# Patient Record
Sex: Female | Born: 2000 | Race: White | Hispanic: No | Marital: Single | Smoking: Current every day smoker
Health system: Southern US, Community
[De-identification: ages and names within clinical notes are randomized; demographics above are authoritative.]

## PROBLEM LIST (undated history)

## (undated) DIAGNOSIS — F419 Anxiety disorder, unspecified: Secondary | ICD-10-CM

## (undated) DIAGNOSIS — F1910 Other psychoactive substance abuse, uncomplicated: Secondary | ICD-10-CM

## (undated) DIAGNOSIS — F32A Depression, unspecified: Secondary | ICD-10-CM

## (undated) DIAGNOSIS — F1110 Opioid abuse, uncomplicated: Secondary | ICD-10-CM

## (undated) DIAGNOSIS — F329 Major depressive disorder, single episode, unspecified: Secondary | ICD-10-CM

## (undated) HISTORY — PX: NO PAST SURGERIES: SHX2092

---
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
## 2020-06-11 IMAGING — CR PORTABLE CHEST - 1 VIEW
2 series · 2 of 2 positions shown · non-contrast
Comparison: None.

CLINICAL DATA: 18 y/o  F; overdose.

EXAM:
PORTABLE CHEST 1 VIEW

[portable (1 of 2)]
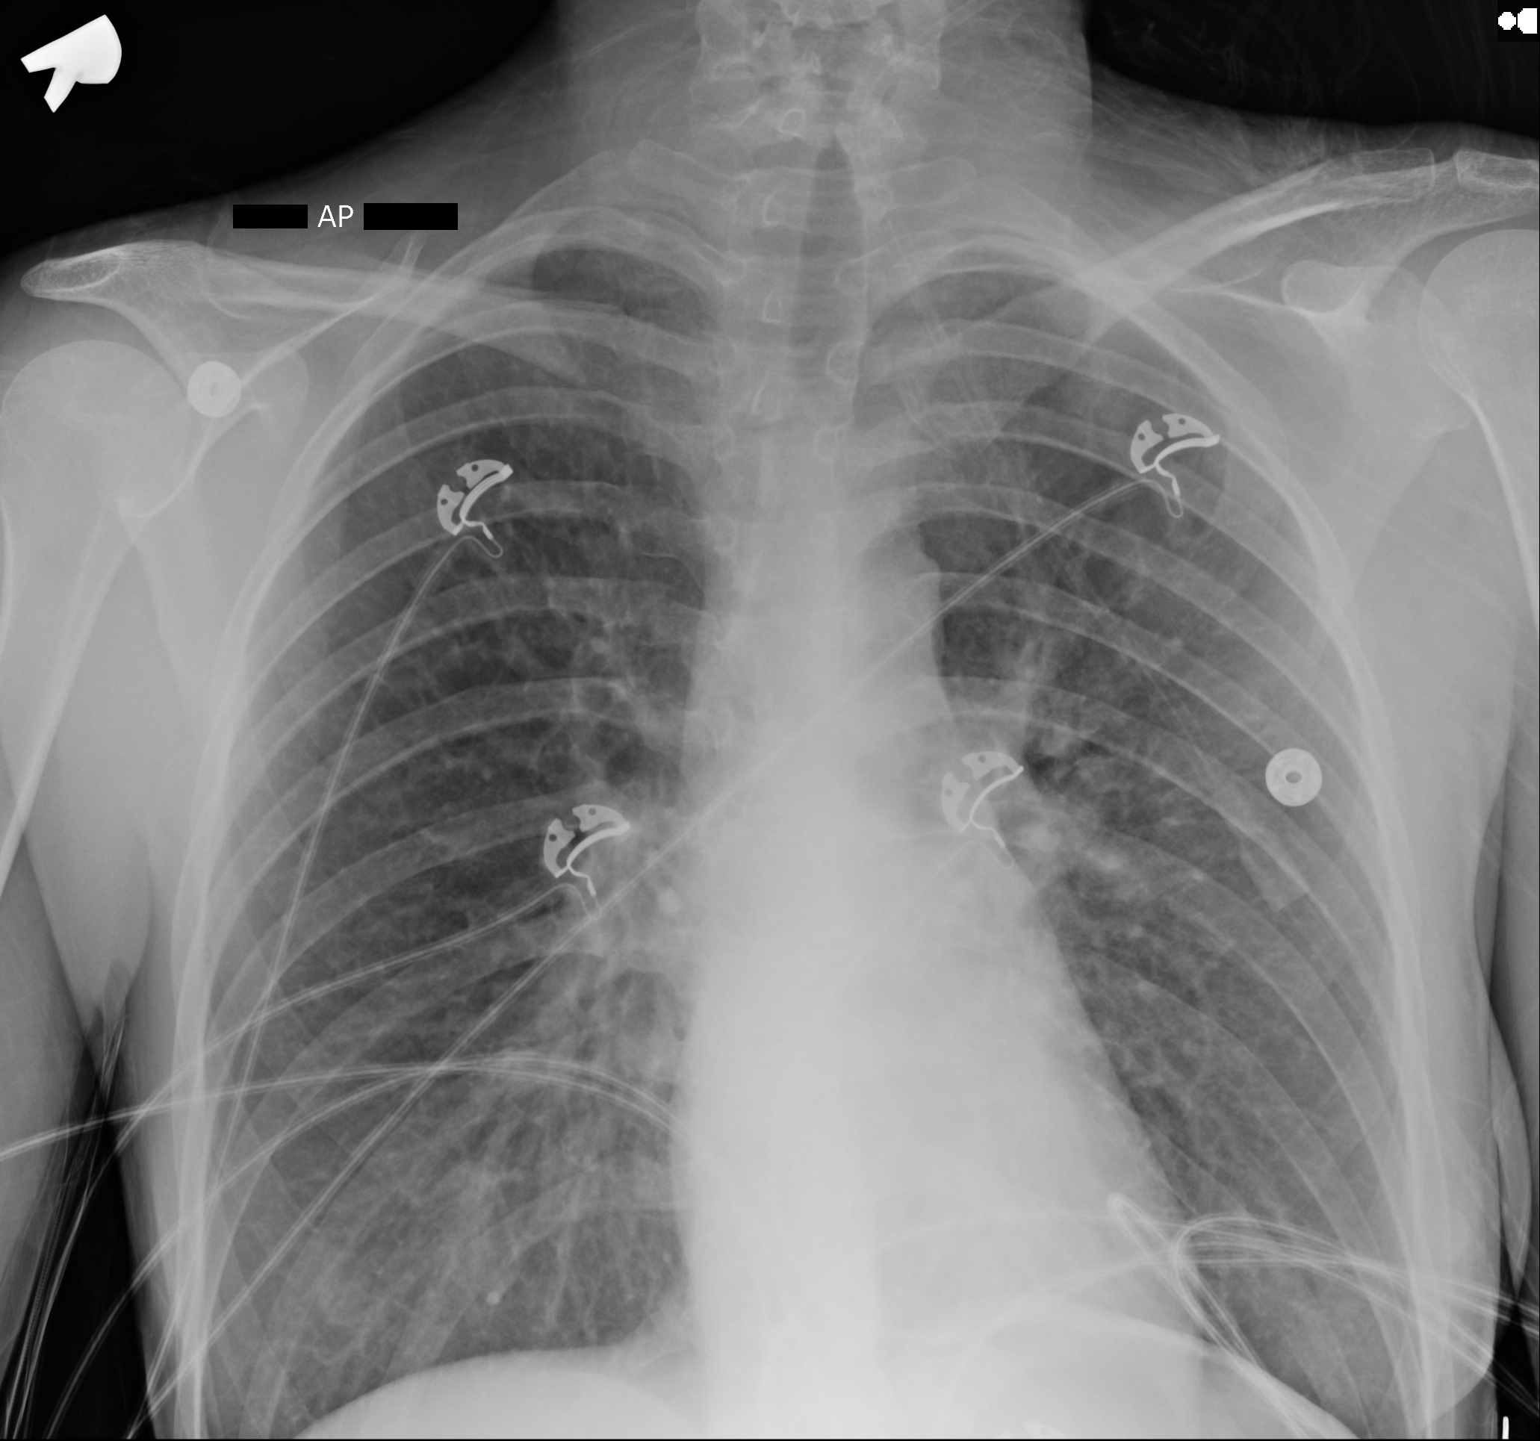

[portable (2 of 2)]
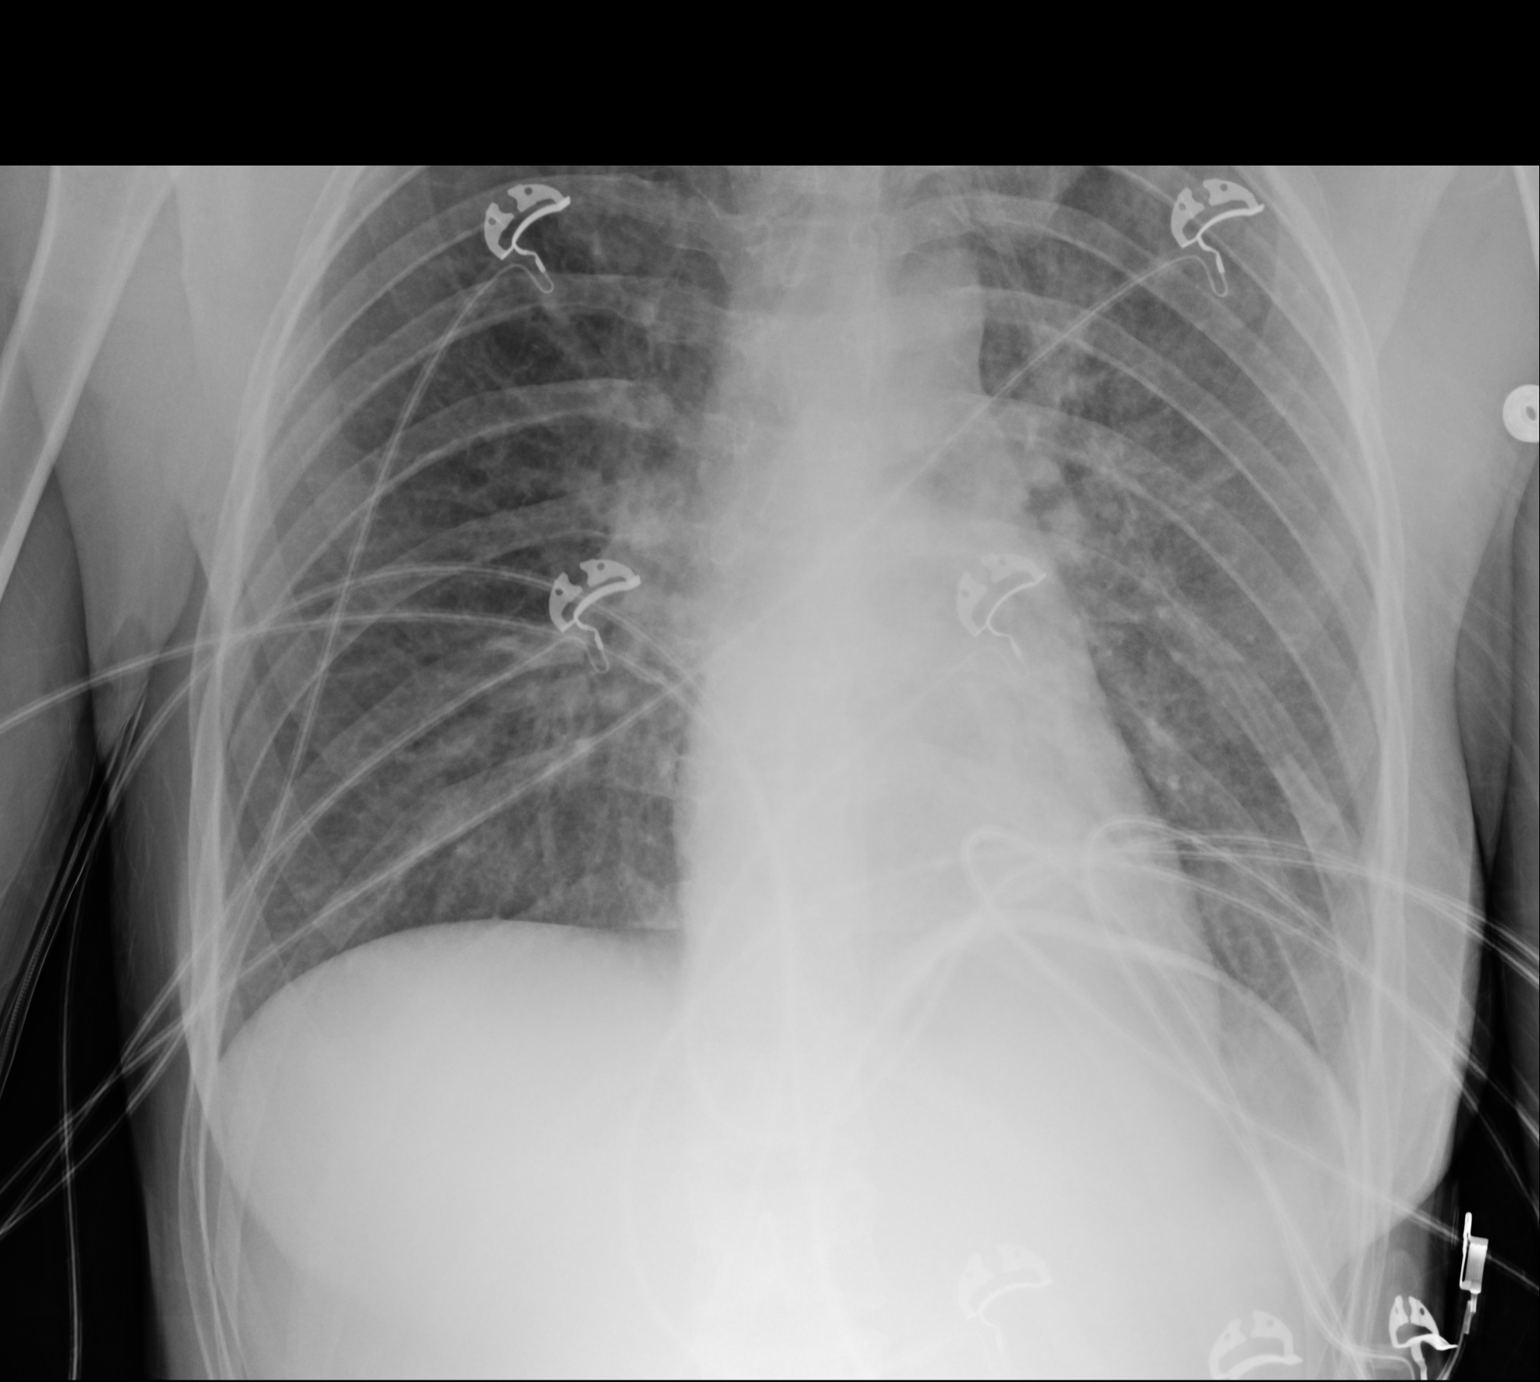

[2 of 2 positions shown; findings below may reference images not displayed]

FINDINGS: Normal cardiac silhouette. Hazy central opacification of the lungs.
No pleural effusion or pneumothorax. Bones are unremarkable.
IMPRESSION: Hazy central opacification of lungs, possibly mild noncardiogenic
pulmonary edema related to overdose.

## 2020-10-15 ENCOUNTER — Other Ambulatory Visit: Payer: Self-pay

## 2020-10-15 ENCOUNTER — Observation Stay (HOSPITAL_COMMUNITY)
Admission: EM | Admit: 2020-10-15 | Discharge: 2020-10-15 | Discharge status: Against Medical Advice | Payer: Medicaid Other | Attending: General Surgery | Admitting: General Surgery

## 2020-10-15 ENCOUNTER — Encounter (HOSPITAL_COMMUNITY): Payer: Self-pay | Admitting: Emergency Medicine

## 2020-10-15 ENCOUNTER — Emergency Department (HOSPITAL_COMMUNITY): Payer: Medicaid Other

## 2020-10-15 DIAGNOSIS — F17210 Nicotine dependence, cigarettes, uncomplicated: Secondary | ICD-10-CM | POA: Diagnosis not present

## 2020-10-15 DIAGNOSIS — Z79899 Other long term (current) drug therapy: Secondary | ICD-10-CM | POA: Diagnosis not present

## 2020-10-15 DIAGNOSIS — R791 Abnormal coagulation profile: Secondary | ICD-10-CM | POA: Insufficient documentation

## 2020-10-15 DIAGNOSIS — N9489 Other specified conditions associated with female genital organs and menstrual cycle: Secondary | ICD-10-CM | POA: Diagnosis not present

## 2020-10-15 DIAGNOSIS — L0291 Cutaneous abscess, unspecified: Secondary | ICD-10-CM

## 2020-10-15 DIAGNOSIS — Z20822 Contact with and (suspected) exposure to covid-19: Secondary | ICD-10-CM | POA: Insufficient documentation

## 2020-10-15 DIAGNOSIS — N611 Abscess of the breast and nipple: Secondary | ICD-10-CM | POA: Diagnosis not present

## 2020-10-15 LAB — CBC WITH DIFFERENTIAL/PLATELET
Abs Immature Granulocytes: 0.09 10*3/uL — ABNORMAL HIGH (ref 0.00–0.07)
Basophils Absolute: 0 10*3/uL (ref 0.0–0.1)
Basophils Relative: 0 %
Eosinophils Absolute: 0.1 10*3/uL (ref 0.0–0.5)
Eosinophils Relative: 1 %
HCT: 39.2 % (ref 36.0–46.0)
Hemoglobin: 12.9 g/dL (ref 12.0–15.0)
Immature Granulocytes: 1 %
Lymphocytes Relative: 22 %
Lymphs Abs: 2.8 10*3/uL (ref 0.7–4.0)
MCH: 28.9 pg (ref 26.0–34.0)
MCHC: 32.9 g/dL (ref 30.0–36.0)
MCV: 87.7 fL (ref 80.0–100.0)
Monocytes Absolute: 0.9 10*3/uL (ref 0.1–1.0)
Monocytes Relative: 7 %
Neutro Abs: 8.9 10*3/uL — ABNORMAL HIGH (ref 1.7–7.7)
Neutrophils Relative %: 69 %
Platelets: 370 10*3/uL (ref 150–400)
RBC: 4.47 MIL/uL (ref 3.87–5.11)
RDW: 12 % (ref 11.5–15.5)
WBC: 12.8 10*3/uL — ABNORMAL HIGH (ref 4.0–10.5)
nRBC: 0 % (ref 0.0–0.2)

## 2020-10-15 LAB — BASIC METABOLIC PANEL
Anion gap: 8 (ref 5–15)
BUN: 8 mg/dL (ref 6–20)
CO2: 30 mmol/L (ref 22–32)
Calcium: 8.9 mg/dL (ref 8.9–10.3)
Chloride: 97 mmol/L — ABNORMAL LOW (ref 98–111)
Creatinine, Ser: 0.8 mg/dL (ref 0.44–1.00)
GFR, Estimated: >60 mL/min (ref >60)
Glucose, Bld: 100 mg/dL — ABNORMAL HIGH (ref 70–99)
Potassium: 3.5 mmol/L (ref 3.5–5.1)
Sodium: 135 mmol/L (ref 135–145)

## 2020-10-15 LAB — RESP PANEL BY RT-PCR (FLU A&B, COVID) ARPGX2
Influenza A by PCR: NEGATIVE
Influenza B by PCR: NEGATIVE
SARS Coronavirus 2 by RT PCR: NEGATIVE

## 2020-10-15 LAB — URINALYSIS, ROUTINE W REFLEX MICROSCOPIC
Bacteria, UA: NONE SEEN
Bilirubin Urine: NEGATIVE
Glucose, UA: NEGATIVE mg/dL
Ketones, ur: NEGATIVE mg/dL
Nitrite: NEGATIVE
Protein, ur: NEGATIVE mg/dL
Specific Gravity, Urine: 1.008 (ref 1.005–1.030)
pH: 7 (ref 5.0–8.0)

## 2020-10-15 LAB — RAPID URINE DRUG SCREEN, HOSP PERFORMED
Amphetamines: POSITIVE — AB
Barbiturates: NOT DETECTED
Benzodiazepines: POSITIVE — AB
Cocaine: NOT DETECTED
Opiates: NOT DETECTED
Tetrahydrocannabinol: NOT DETECTED

## 2020-10-15 LAB — I-STAT BETA HCG BLOOD, ED (MC, WL, AP ONLY): I-stat hCG, quantitative: <5 m[IU]/mL (ref <5)

## 2020-10-15 LAB — LACTIC ACID, PLASMA
Lactic Acid, Venous: 0.8 mmol/L (ref 0.5–1.9)
Lactic Acid, Venous: 1 mmol/L (ref 0.5–1.9)

## 2020-10-15 LAB — RPR: RPR Ser Ql: NONREACTIVE

## 2020-10-15 LAB — PROTIME-INR
INR: 1 (ref 0.8–1.2)
Prothrombin Time: 13.5 seconds (ref 11.4–15.2)

## 2020-10-15 LAB — APTT: aPTT: 31 seconds (ref 24–36)

## 2020-10-15 LAB — HIV ANTIBODY (ROUTINE TESTING W REFLEX): HIV Screen 4th Generation wRfx: NONREACTIVE

## 2020-10-15 MED ORDER — VANCOMYCIN HCL IN DEXTROSE 1-5 GM/200ML-% IV SOLN
1000.0000 mg | Freq: Once | INTRAVENOUS | Status: AC
  Administered 2020-10-15: 1000 mg via INTRAVENOUS
  Filled 2020-10-15: qty 200

## 2020-10-15 MED ORDER — VANCOMYCIN HCL IN DEXTROSE 1-5 GM/200ML-% IV SOLN
1000.0000 mg | Freq: Two times a day (BID) | INTRAVENOUS | Status: DC
  Administered 2020-10-15: 1000 mg via INTRAVENOUS
  Filled 2020-10-15: qty 200

## 2020-10-15 MED ORDER — ONDANSETRON 4 MG PO TBDP: 4.0000 mg | ORAL_TABLET | Freq: Four times a day (QID) | ORAL | Status: DC | PRN

## 2020-10-15 MED ORDER — LACTATED RINGERS IV BOLUS (SEPSIS)
1000.0000 mL | Freq: Once | INTRAVENOUS | Status: AC
  Administered 2020-10-15: 1000 mL via INTRAVENOUS

## 2020-10-15 MED ORDER — KETOROLAC TROMETHAMINE 30 MG/ML IJ SOLN
30.0000 mg | Freq: Four times a day (QID) | INTRAMUSCULAR | Status: DC
  Administered 2020-10-15: 30 mg via INTRAVENOUS
  Filled 2020-10-15: qty 1

## 2020-10-15 MED ORDER — SODIUM CHLORIDE 0.9 % IV SOLN: INTRAVENOUS | Status: DC

## 2020-10-15 MED ORDER — HYDROMORPHONE HCL 1 MG/ML IJ SOLN
1.0000 mg | INTRAMUSCULAR | Status: DC | PRN
Start: 2020-10-15 — End: 2020-10-16
  Administered 2020-10-15: 1 mg via INTRAVENOUS
  Filled 2020-10-15: qty 1

## 2020-10-15 MED ORDER — ONDANSETRON HCL 4 MG/2ML IJ SOLN: 4.0000 mg | Freq: Four times a day (QID) | INTRAMUSCULAR | Status: DC | PRN

## 2020-10-15 MED ORDER — LACTATED RINGERS IV BOLUS (SEPSIS)
500.0000 mL | Freq: Once | INTRAVENOUS | Status: AC
  Administered 2020-10-15: 500 mL via INTRAVENOUS

## 2020-10-15 MED ORDER — MORPHINE SULFATE (PF) 4 MG/ML IV SOLN
4.0000 mg | Freq: Once | INTRAVENOUS | Status: AC
  Administered 2020-10-15: 4 mg via INTRAVENOUS
  Filled 2020-10-15: qty 1

## 2020-10-15 MED ORDER — LORAZEPAM 2 MG/ML IJ SOLN: 1.0000 mg | INTRAMUSCULAR | Status: DC | PRN

## 2020-10-15 MED ORDER — ACETAMINOPHEN 650 MG RE SUPP: 650.0000 mg | Freq: Four times a day (QID) | RECTAL | Status: DC | PRN

## 2020-10-15 MED ORDER — LACTATED RINGERS IV SOLN: INTRAVENOUS | Status: DC

## 2020-10-15 MED ORDER — FENTANYL CITRATE PF 50 MCG/ML IJ SOSY
100.0000 ug | PREFILLED_SYRINGE | Freq: Once | INTRAMUSCULAR | Status: AC
  Administered 2020-10-15: 100 ug via INTRAVENOUS
  Filled 2020-10-15: qty 2

## 2020-10-15 MED ORDER — LACTATED RINGERS IV BOLUS (SEPSIS)
250.0000 mL | Freq: Once | INTRAVENOUS | Status: AC
  Administered 2020-10-15: 250 mL via INTRAVENOUS

## 2020-10-15 MED ORDER — KETOROLAC TROMETHAMINE 30 MG/ML IJ SOLN: 30.0000 mg | Freq: Four times a day (QID) | INTRAMUSCULAR | Status: DC | PRN

## 2020-10-15 MED ORDER — OXYCODONE HCL 5 MG PO TABS: 5.0000 mg | ORAL_TABLET | ORAL | Status: DC | PRN

## 2020-10-15 MED ORDER — ACETAMINOPHEN 325 MG PO TABS: 650.0000 mg | ORAL_TABLET | Freq: Four times a day (QID) | ORAL | Status: DC | PRN

## 2020-10-15 NOTE — ED Triage Notes (Signed)
Pt has abscess to left upper breast. Pt states she noticed it about 2 wks ago but it has increased in size since then.

## 2020-10-15 NOTE — ED Provider Notes (Signed)
[facility] EMERGENCY DEPARTMENT Provider Note   CSN: [id number] Arrival date & time: 10/15/20  0216     History Chief Complaint  Patient presents with   Abscess    Judy Gibson is a 20 y.o. female.  The history is provided by the patient.  Abscess Location:  Torso Torso abscess location:  L breast Abscess quality: induration and painful   Red streaking: no   Duration:  1 week Progression:  Worsening Pain details:    Quality:  Dull   Severity:  Severe   Timing:  Constant   Progression:  Worsening Chronicity:  New Context: injected drug use   Context: not diabetes   Relieved by:  Nothing Exacerbated by: palpation. Associated symptoms: no fever and no vomiting   Risk factors: no prior abscess   Patient with history of anxiety and IV drug abuse presents with abscess to left breast.  This has  been present for at least a week.  She reports it is getting larger and more painful.  No fevers or vomiting.  She has never had this before.  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 No pertinent surgical history.   OB History   No obstetric history on file.     Family History  Family history unknown: Yes    Social History   Tobacco Use   Smoking status: Every Day    Packs/day: 1.00    Years: 3.00    Pack years: 3.00    Types: Cigarettes   Smokeless tobacco: Never  Vaping Use   Vaping Use: Never used  Substance Use Topics   Alcohol use: Not Currently    Alcohol/week: 1.0 standard drink    Types: 1 Cans of  beer per week    Comment: 1 beer one yr ago   Drug use: Not Currently    Types: Heroin, Marijuana    Comment: xanax    Home Medications Prior to Admission medications   Medication Sig Start Date End Date Taking? Authorizing Provider  fluconazole (DIFLUCAN) 200 MG tablet Take one dose by mouth, wait 72 hours, and then take second dose by mouth 11/12/19   Wurst, [place], PA-C  metroNIDAZOLE (FLAGYL) 500 MG tablet Take 1 tablet (500 mg total) by mouth 2 (two) times daily. 11/15/19   Merrilee Jansky, MD  naloxone [facility]) 0.4 MG/ML injection Inject one mL for overdose of opiates 01/30/19   Eber Hong, MD  permethrin (ELIMITE) 5 % cream Apply cream from head to feet, leave on for 8 to 14 hours, then wash with soap/water, repeat application if living mites present 14 days after initial treatment 11/12/19   Wurst, [place], PA-C  citalopram (CELEXA) 10 MG tablet Take 1 tablet (10 mg total) by mouth daily. 07/17/18 11/25/18  Aldean Baker, NP    Allergies    Patient has no known allergies.  Review of Systems   Review of Systems  Constitutional:  Negative for fever.  Respiratory:  Negative for shortness of breath.  Gastrointestinal:  Negative for vomiting.  Skin:  Positive for wound.  All other systems reviewed and are negative.  Physical Exam Updated Vital Signs BP 99/67   Pulse (!) 103   Temp 98.9 F (37.2 C)   Resp (!) 9   Ht 1.727 m (5\' 8" )   Wt 56.7 kg   SpO2 100%   BMI 19.01 kg/m   Physical Exam CONSTITUTIONAL: Cachectic, sleeping on arrival to room HEAD: Normocephalic/atraumatic EYES: EOMI/PERRL ENMT: Mask in place NECK: supple no meningeal signs SPINE/BACK:entire spine nontender CV: S1/S2 noted, tachycardic LUNGS: Lungs are clear to auscultation bilaterally, no apparent distress ABDOMEN: soft, nontender NEURO: Pt is sleeping but is arousable, moves all extremities arms for EXTREMITIES: pulses normal/equal, full ROM SKIN: Breast exam chaperoned by nurse  The right breast is unremarkable.  Left breast reveals large area of induration and significant tenderness superior to the nipple.  No crepitus.  There is no significant overlying erythema.  There is no nipple discharge.  ED Results / Procedures / Treatments   Labs (all labs ordered are listed, but only abnormal results are displayed) Labs Reviewed  CBC WITH DIFFERENTIAL/PLATELET - Abnormal; Notable for the following components:      Result Value   WBC 12.8 (*)    Neutro Abs 8.9 (*)    Abs Immature Granulocytes 0.09 (*)    All other components within normal limits  BASIC METABOLIC PANEL - Abnormal; Notable for the following components:   Chloride 97 (*)    Glucose, Bld 100 (*)    All other components within normal limits  RESP PANEL BY RT-PCR (FLU A&B, COVID) ARPGX2  CULTURE, BLOOD (ROUTINE X 2)  LACTIC ACID, PLASMA  PROTIME-INR  APTT  LACTIC ACID, PLASMA  URINALYSIS, ROUTINE W REFLEX MICROSCOPIC  RAPID URINE DRUG SCREEN, HOSP PERFORMED  HIV ANTIBODY (ROUTINE TESTING W REFLEX)  RPR  I-STAT BETA HCG BLOOD, ED ([facility], [facility], [facility] ONLY)  GC/CHLAMYDIA PROBE AMP ([place]) NOT AT [facility]    EKG None  Radiology No results found.  Procedures Ultrasound ED Soft Tissue  Date/Time: 10/15/2020 4:12 AM Performed by: 10/17/2020, MD Authorized by: Zadie Rhine, MD   Procedure details:    Indications: localization of abscess     Transverse view:  Visualized   Longitudinal view:  Visualized   Images: archived     Limitations:  Positioning Location:    Location: breast     Side:  Left Findings:     abscess present .Critical Care  Date/Time: 10/15/2020 5:03 AM Performed by: 10/17/2020, MD Authorized by: Zadie Rhine, MD   Critical care provider statement:    Critical care time (minutes):  42   Critical care start time:  10/15/2020 5:03 AM   Critical care end time:  10/15/2020 5:45 AM   Critical care time was exclusive of:  Separately billable  procedures and treating other patients   Critical care was necessary to treat or prevent imminent or life-threatening deterioration of the following conditions:  Sepsis, shock and dehydration   Critical care was time spent personally by me on the following activities:  Development of treatment plan with patient or surrogate, discussions with consultants, examination of patient, re-evaluation of patient's condition, pulse oximetry, ordering and review of laboratory studies and ordering and performing treatments and interventions   I assumed direction of critical care for this patient from another provider in my specialty: no     Medications Ordered in ED Medications  lactated ringers infusion ( Intravenous  New Bag/Given 10/15/20 0437)  vancomycin (VANCOCIN) IVPB 1000 mg/200 mL premix (1,000 mg Intravenous New Bag/Given 10/15/20 0613)  vancomycin (VANCOCIN) IVPB 1000 mg/200 mL premix (has no administration in time range)  lactated ringers bolus 1,000 mL (1,000 mLs Intravenous New Bag/Given 10/15/20 0437)    And  lactated ringers bolus 500 mL (500 mLs Intravenous New Bag/Given 10/15/20 0436)    And  lactated ringers bolus 250 mL (250 mLs Intravenous New Bag/Given 10/15/20 0437)  fentaNYL (SUBLIMAZE) injection 100 mcg (100 mcg Intravenous Given 10/15/20 0424)    ED Course  I have reviewed the triage vital signs and the nursing notes.  Pertinent labs & imaging results that were available during my care of the patient were reviewed by me and considered in my medical decision making (see chart for details).    MDM Rules/Calculators/A&P                           Patient with history of IVDA presents with increasing swelling and pain and concern for abscess to left breast.  Patient has a large area of induration and tenderness to the breast.  Bedside ultrasound does reveal significant amount of fluid We will start with labs and reassess 5:04 AM Overall labs are reassuring except for  leukocytosis. Patient did have significant tachycardia that is now improving after IV fluids. Plan to give her IV antibiotics.  At this point her management will consist of ultrasound of the breast later in the morning and a general surgery consult. 6:24 AM Patient stable but still tachycardic. BP 106/68   Pulse (!) 112   Temp 98.9 F (37.2 C)   Resp 19   Ht 1.727 m (5\' 8" )   Wt 56.7 kg   SpO2 100%   BMI 19.01 kg/m  She has received IV fluids and IV antibiotics.  Fortunately her lactic acid is negative I discussed the case with Dr. with general surgery Plan will be to obtain formal soft tissue ultrasound to identify the depth of the potential abscess.  He will evaluate the patient in the ED 6:58 AM Signed out to Dr. Franky Macho at shift change to f/u on Pose[facility] Rea imaging and surgery consult Final Clinical Impression(s) / ED Diagnoses Final diagnoses:  Breast abscess    Rx / DC Orders ED Discharge Orders     None        [place], MD 10/15/20 [phone]

## 2020-10-15 NOTE — H&P (Signed)
Subjective:   Patient is a 20 y.o. female who presents with a left sided breast mass. She first noticed a "small lump" two and a half weeks ago, and states it abruptly grew in size overnight approximately 5 days ago. It has since gradually continued to increase in size and caused her pain, burning, and itching in the upper inner and upper outer left quadrants. Additionally, she states she has had some sharp, pleuritic left sided chest pain over the past two days. She endorses fatigue over the past three days, and she does not endorse fever, chills, night sweats, palpitations, midline burning chest pain, changes in vision, dizziness, confusion. Reported past history includes IV drug use (fentanyl), anxiety, depression, and PTSD. She states she has not used drugs in 3 weeks.  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   MDD (major depressive disorder), recurrent episode, severe (HCC) 07/13/2018   Heroin overdose (HCC) 07/12/2018   Hyperglycemia 07/12/2018   Elevated serum creatinine 07/12/2018   Past Medical History:  Diagnosis Date   Anxiety    Depression    Heroin abuse (HCC)    Substance abuse (HCC)     Pneumothorax w/ chest tube placement (2021)  No Known Allergies  Social History   Tobacco Use   Smoking status: Every Day    Packs/day: 1.00    Years: 3.00    Pack years: 3.00    Types: Cigarettes   Smokeless tobacco: Never  Substance Use Topics   Alcohol use: Not Currently    Alcohol/week: 1.0 standard drink    Types: 1 Cans of beer per week    Comment: 1 beer one yr ago    Family History: Lung cancer Breast cancer - grandmother (diagnosed at 34 yrs old)   Review of Systems Pertinent items noted in HPI and remainder of comprehensive ROS otherwise negative.  Objective:   Patient Vitals for the past 8 hrs:  BP Temp Temp src Pulse Resp SpO2  10/15/20 1316 124/71 98.4 F (36.9 C) Oral (!) 111 17 100 %   10/15/20 1245 113/82 -- -- (!) 113 15 99 %  10/15/20 1100 95/83 -- -- (!) 110 17 100 %  10/15/20 1030 105/72 -- -- (!) 109 11 99 %  10/15/20 1000 108/73 -- -- (!) 106 20 100 %  10/15/20 0930 103/75 -- -- (!) 104 20 100 %  10/15/20 0900 108/79 -- -- (!) 104 (!) 21 100 %  10/15/20 0834 106/79 99.2 F (37.3 C) Oral (!) 112 16 100 %  10/15/20 0830 106/74 -- -- (!) 107 20 100 %  10/15/20 0800 101/78 -- -- 96 -- 100 %  10/15/20 0745 -- -- -- -- 17 --  10/15/20 0730 103/64 -- -- (!) 111 20 100 %  10/15/20 0700 99/61 -- -- (!) 110 17 100 %   I/O last 3 completed shifts: In: 4783.3 [IV Piggyback:4783.3] Out: -  Total I/O In: 194.7 [IV Piggyback:194.7] Out: -   Physical Exam: BP 124/71 (BP Location: Left Arm)   Pulse (!) 111   Temp 98.4 F (36.9 C) (Oral)   Resp 17   Ht 5\' 8"  (1.727 m)   Wt 56.7 kg   SpO2 100%   BMI 19.01 kg/m   Constitutional: well-appearing and sitting in bed comfortably, in no acute distress Cardiovascular: regular rate and rhythm, no m/r/g Pulmonary/Chest: normal work of breathing on room air, lungs clear to auscultation bilaterally Breast: approximately  10 cm midline breast mass crossing the upper inner and upper outer quadrant, firm, no overlying erythema, diffuse tenderness to palpation extending to the left axilla  Data Review:  Results for orders placed or performed during the hospital encounter of 10/15/20 (from the past 48 hour(s))  Urinalysis, Routine w reflex microscopic Urine, Clean Catch     Status: Abnormal   Collection Time: 10/15/20  4:00 AM  Result Value Ref Range   Color, Urine STRAW (A) YELLOW   APPearance HAZY (A) CLEAR   Specific Gravity, Urine 1.008 1.005 - 1.030   pH 7.0 5.0 - 8.0   Glucose, UA NEGATIVE NEGATIVE mg/dL   Hgb urine dipstick MODERATE (A) NEGATIVE   Bilirubin Urine NEGATIVE NEGATIVE   Ketones, ur NEGATIVE NEGATIVE mg/dL   Protein, ur NEGATIVE NEGATIVE mg/dL   Nitrite NEGATIVE NEGATIVE   Leukocytes,Ua MODERATE (A)  NEGATIVE   RBC / HPF 6-10 0 - 5 RBC/hpf   WBC, UA 21-50 0 - 5 WBC/hpf   Bacteria, UA NONE SEEN NONE SEEN   Squamous Epithelial / LPF 0-5 0 - 5    Comment: Performed at [facility], [address], [place], [place] [address]  Rapid urine drug screen (hospital performed)     Status: Abnormal   Collection Time: 10/15/20  4:01 AM  Result Value Ref Range   Opiates NONE DETECTED NONE DETECTED   Cocaine NONE DETECTED NONE DETECTED   Benzodiazepines POSITIVE (A) NONE DETECTED   Amphetamines POSITIVE (A) NONE DETECTED   Tetrahydrocannabinol NONE DETECTED NONE DETECTED   Barbiturates NONE DETECTED NONE DETECTED    Comment: (NOTE) DRUG SCREEN FOR MEDICAL PURPOSES ONLY.  IF CONFIRMATION IS NEEDED FOR ANY PURPOSE, NOTIFY LAB WITHIN 5 DAYS.  LOWEST DETECTABLE LIMITS FOR URINE DRUG SCREEN Drug Class                     Cutoff (ng/mL) Amphetamine and metabolites    1000 Barbiturate and metabolites    200 Benzodiazepine                 200 Tricyclics and metabolites     300 Opiates and metabolites        300 Cocaine and metabolites        300 THC                            50 Performed at [facility], [address], [place], [place] [address]   Resp Panel by RT-PCR (Flu A&B, Covid) Nasopharyngeal Swab     Status: None   Collection Time: 10/15/20  4:10 AM   Specimen: Nasopharyngeal Swab; Nasopharyngeal(NP) swabs in vial transport medium  Result Value Ref Range   SARS Coronavirus 2 by RT PCR NEGATIVE NEGATIVE    Comment: (NOTE) SARS-CoV-2 target nucleic acids are NOT DETECTED.  The SARS-CoV-2 RNA is generally detectable in upper respiratory specimens during the acute phase of infection. The lowest concentration of SARS-CoV-2 viral copies this assay can detect is 138 copies/mL. A negative result does not preclude SARS-Cov-2 infection and should not be used as the sole basis for treatment or other patient management decisions. A negative result may occur with  improper specimen  collection/handling, submission of specimen other than nasopharyngeal swab, presence of viral mutation(s) within the areas targeted by this assay, and inadequate number of viral copies(<138 copies/mL). A negative result must be combined with clinical observations, patient history, and epidemiological information. The expected result  is Negative.  Fact Sheet for Patients:  BloggerCourse.com  Fact Sheet for Healthcare Providers:  SeriousBroker.it  This test is no t yet approved or cleared by the [place] FDA and  has been authorized for detection and/or diagnosis of SARS-CoV-2 by FDA under an Emergency Use Authorization (EUA). This EUA will remain  in effect (meaning this test can be used) for the duration of the COVID-19 declaration under Section 564(b)(1) of the Act, 21 U.S.C.section 360bbb-3(b)(1), unless the authorization is terminated  or revoked sooner.       Influenza A by PCR NEGATIVE NEGATIVE   Influenza B by PCR NEGATIVE NEGATIVE    Comment: (NOTE) The Xpert Xpress SARS-CoV-2/FLU/RSV plus assay is intended as an aid in the diagnosis of influenza from Nasopharyngeal swab specimens and should not be used as a sole basis for treatment. Nasal washings and aspirates are unacceptable for Xpert Xpress SARS-CoV-2/FLU/RSV testing.  Fact Sheet for Patients: BloggerCourse.com  Fact Sheet for Healthcare Providers: SeriousBroker.it  This test is not yet approved or cleared by the [place] FDA and has been authorized for detection and/or diagnosis of SARS-CoV-2 by FDA under an Emergency Use Authorization (EUA). This EUA will remain in effect (meaning this test can be used) for the duration of the COVID-19 declaration under Section 564(b)(1) of the Act, 21 U.S.C. section 360bbb-3(b)(1), unless the authorization is terminated or revoked.  Performed at [facility],  [address], [place], [place] [address]   Lactic acid, plasma     Status: None   Collection Time: 10/15/20  4:10 AM  Result Value Ref Range   Lactic Acid, Venous 0.8 0.5 - 1.9 mmol/L    Comment: Performed at [facility], [address] [place], [place] [address]  CBC WITH DIFFERENTIAL     Status: Abnormal   Collection Time: 10/15/20  4:10 AM  Result Value Ref Range   WBC 12.8 (H) 4.0 - 10.5 K/uL   RBC 4.47 3.87 - 5.11 MIL/uL   Hemoglobin 12.9 12.0 - 15.0 g/dL   HCT 73.5 32.9 - 92.4 %   MCV 87.7 80.0 - 100.0 fL   MCH 28.9 26.0 - 34.0 pg   MCHC 32.9 30.0 - 36.0 g/dL   RDW 26.8 34.1 - 96.2 %   Platelets 370 150 - 400 K/uL   nRBC 0.0 0.0 - 0.2 %   Neutrophils Relative % 69 %   Neutro Abs 8.9 (H) 1.7 - 7.7 K/uL   Lymphocytes Relative 22 %   Lymphs Abs 2.8 0.7 - 4.0 K/uL   Monocytes Relative 7 %   Monocytes Absolute 0.9 0.1 - 1.0 K/uL   Eosinophils Relative 1 %   Eosinophils Absolute 0.1 0.0 - 0.5 K/uL   Basophils Relative 0 %   Basophils Absolute 0.0 0.0 - 0.1 K/uL   Immature Granulocytes 1 %   Abs Immature Granulocytes 0.09 (H) 0.00 - 0.07 K/uL    Comment: Performed at [facility], [address], [place], [place] [address]  Protime-INR     Status: None   Collection Time: 10/15/20  4:10 AM  Result Value Ref Range   Prothrombin Time 13.5 11.4 - 15.2 seconds   INR 1.0 0.8 - 1.2    Comment: (NOTE) INR goal varies based on device and disease states. Performed at [facility], [address], [place], [place] [address]   APTT     Status: None   Collection Time: 10/15/20  4:10 AM  Result Value Ref Range   aPTT 31  24 - 36 seconds    Comment: Performed at [facility], [address] [place], [place] [address]  Blood Culture (routine x 2)     Status: None (Preliminary result)   Collection Time: 10/15/20  4:10 AM   Specimen: BLOOD RIGHT FOREARM  Result Value Ref Range   Specimen Description BLOOD RIGHT FOREARM    Special Requests      BOTTLES DRAWN AEROBIC AND ANAEROBIC Blood  Culture results may not be optimal due to an excessive volume of blood received in culture bottles   Culture      NO GROWTH < 12 HOURS Performed at [facility], [address] [place], [place] [address]    Report Status PENDING   Basic metabolic panel     Status: Abnormal   Collection Time: 10/15/20  4:10 AM  Result Value Ref Range   Sodium 135 135 - 145 mmol/L   Potassium 3.5 3.5 - 5.1 mmol/L   Chloride 97 (L) 98 - 111 mmol/L   CO2 30 22 - 32 mmol/L   Glucose, Bld 100 (H) 70 - 99 mg/dL    Comment: Glucose reference range applies only to samples taken after fasting for at least 8 hours.   BUN 8 6 - 20 mg/dL   Creatinine, Ser 0.980.80 0.44 - 1.00 mg/dL   Calcium 8.9 8.9 - 11.910.3 mg/dL   GFR, Estimated >14>60 >78>60 mL/min    Comment: (NOTE) Calculated using the CKD-EPI Creatinine Equation (2021)    Anion gap 8 5 - 15    Comment: Performed at [facility], [address] [place], [place] [address]  I-Stat Beta hCG blood, ED ([facility], [facility], [facility] only)     Status: None   Collection Time: 10/15/20  4:17 AM  Result Value Ref Range   I-stat hCG, quantitative <5.0 <5 mIU/mL   Comment 3            Comment:   GEST. AGE      CONC.  (mIU/mL)   <=1 WEEK        5 - 50     2 WEEKS       50 - 500     3 WEEKS       100 - 10,000     4 WEEKS     1,000 - 30,000        FEMALE AND NON-PREGNANT FEMALE:     LESS THAN 5 mIU/mL   Lactic acid, plasma     Status: None   Collection Time: 10/15/20  5:52 AM  Result Value Ref Range   Lactic Acid, Venous 1.0 0.5 - 1.9 mmol/L    Comment: Performed at [facility], [address] [place], [place] [address]  HIV Antibody (routine testing w rflx)     Status: None   Collection Time: 10/15/20  5:52 AM  Result Value Ref Range   HIV Screen 4th Generation wRfx Non Reactive Non Reactive    Comment: Performed at [facility], [address] [place], [place] [address]  RPR     Status: None   Collection Time: 10/15/20  5:52 AM  Result Value Ref Range   RPR Ser Ql NON REACTIVE  NON REACTIVE    Comment: Performed at [facility], [address] [place], [place] [address]   Pertinent Imaging: Ultrasound, Left Breast Hypoechoic mass or masslike collection in the left breast 5 cm from the nipple measuring up to 10.1 cm in the 10-11 o'clock position  Assessment:   Principal Problem:   Breast abscess, left Active Problems:   Leukocytosis   Possible UTI    Plan:   Patient Summary: Judy Gibson is a 20 year old woman with a history of IV drug use who presents with a left sided breast abscess.  Breast Abscess, Left Leukocytosis She presents with signs and symptoms as noted in the HPI and on physical exam concerning for breast abscess. Ultrasound confirms suspicion for breast abscess and shows 10.1 cm hypoechoic mass. WBC elevated to 12.8 but she is otherwise afebrile. -Ultrasound guided drainage per breast center team tomorrow -Vancomycin 1000 mg q12 hrs -Pain Management:  -acetaminophen 650 mg q6 hrs prn  -toradol 30 mg 6hrs prn for pain not relieved by oral medications -oxycodone 5-10 mg q4 hrs prn for moderate pain -dilaudid 1 mg q3 hrs prn for severe pain  Possible UTI Moderate Hgb and leukocytes present on UA. Patient is asymptomatic and has received extensive prior outpatient workup for STD exposure, vaginal discharge, and urinary symptoms recently. Pt will be on antibiotics for breast abscess moving forward. -No further workup indicated at this time

## 2020-10-15 NOTE — Progress Notes (Signed)
Pharmacy Antibiotic Note  Judy Gibson is a 20 y.o. female admitted on 10/15/2020 with cellulitis.  Pharmacy has been consulted for vancomycin dosing.  Plan: Vancomycin 1gm IV q12 hours F/u cultures and clinical course  Height: 5\' 8"  (172.7 cm) Weight: 56.7 kg (125 lb) IBW/kg (Calculated) : 63.9  Temp (24hrs), Avg:98.9 F (37.2 C), Min:98.9 F (37.2 C), Max:98.9 F (37.2 C)  Recent Labs  Lab 10/15/20 0410  WBC 12.8*  CREATININE 0.80  LATICACIDVEN 0.8    Estimated Creatinine Clearance: 100.4 mL/min (by C-G formula based on SCr of 0.8 mg/dL).    No Known Allergies  Thank you for allowing pharmacy to be a part of this patient's care.  10/17/20 [place] 10/15/2020 5:17 AM

## 2020-10-15 NOTE — ED Notes (Addendum)
Pt to nurses station stating she is leaving. That she does not want to stay. Pt came out in street clothes. When asked about IV she states she removed it. RN asked to look, IV in right forearm was still in place. IV removed. AMA form signed. Pt states she going to [facility]. RN Advised patient was about to be going upstairs. Pt still declined.  hospitalist messaged and notified of departure.

## 2020-10-15 NOTE — ED Notes (Signed)
Pt in bed with eyes closed, pt arouses to verbal stim, pt then returns to pre aroused state, resps even and unlabored, pt remains on cardiac and O2 sat monitor.

## 2020-10-16 LAB — GC/CHLAMYDIA PROBE AMP (~~LOC~~) NOT AT ARMC
Chlamydia: POSITIVE — AB
Comment: NEGATIVE
Comment: NORMAL
Neisseria Gonorrhea: NEGATIVE

## 2020-10-20 LAB — CULTURE, BLOOD (ROUTINE X 2): Culture: NO GROWTH

## 2020-10-26 ENCOUNTER — Other Ambulatory Visit: Payer: Self-pay

## 2020-10-26 DIAGNOSIS — A749 Chlamydial infection, unspecified: Secondary | ICD-10-CM

## 2020-10-26 MED ORDER — DOXYCYCLINE HYCLATE 100 MG PO CAPS: 100.0000 mg | ORAL_CAPSULE | Freq: Two times a day (BID) | ORAL | 0 refills | Status: AC

## 2021-12-23 ENCOUNTER — Encounter (HOSPITAL_COMMUNITY): Payer: Self-pay | Admitting: Family Medicine

## 2021-12-23 ENCOUNTER — Inpatient Hospital Stay (HOSPITAL_COMMUNITY)
Admission: AD | Admit: 2021-12-23 | Discharge: 2021-12-23 | Disposition: A | Discharge status: Home / Self Care | Payer: Medicaid Other | Attending: Family Medicine | Admitting: Family Medicine

## 2021-12-23 ENCOUNTER — Other Ambulatory Visit: Payer: Self-pay

## 2021-12-23 DIAGNOSIS — O99321 Drug use complicating pregnancy, first trimester: Secondary | ICD-10-CM | POA: Insufficient documentation

## 2021-12-23 DIAGNOSIS — Z3A01 Less than 8 weeks gestation of pregnancy: Secondary | ICD-10-CM

## 2021-12-23 DIAGNOSIS — F1110 Opioid abuse, uncomplicated: Secondary | ICD-10-CM | POA: Insufficient documentation

## 2021-12-23 DIAGNOSIS — O23591 Infection of other part of genital tract in pregnancy, first trimester: Secondary | ICD-10-CM | POA: Insufficient documentation

## 2021-12-23 DIAGNOSIS — N898 Other specified noninflammatory disorders of vagina: Secondary | ICD-10-CM | POA: Diagnosis present

## 2021-12-23 DIAGNOSIS — B9689 Other specified bacterial agents as the cause of diseases classified elsewhere: Secondary | ICD-10-CM | POA: Diagnosis not present

## 2021-12-23 DIAGNOSIS — F1190 Opioid use, unspecified, uncomplicated: Secondary | ICD-10-CM | POA: Diagnosis not present

## 2021-12-23 DIAGNOSIS — N760 Acute vaginitis: Secondary | ICD-10-CM | POA: Diagnosis not present

## 2021-12-23 DIAGNOSIS — O418X10 Other specified disorders of amniotic fluid and membranes, first trimester, not applicable or unspecified: Secondary | ICD-10-CM

## 2021-12-23 LAB — URINALYSIS, ROUTINE W REFLEX MICROSCOPIC
Bilirubin Urine: NEGATIVE
Glucose, UA: NEGATIVE mg/dL
Hgb urine dipstick: NEGATIVE
Ketones, ur: NEGATIVE mg/dL
Nitrite: NEGATIVE
Protein, ur: NEGATIVE mg/dL
Specific Gravity, Urine: 1.019 (ref 1.005–1.030)
pH: 6 (ref 5.0–8.0)

## 2021-12-23 LAB — WET PREP, GENITAL
Sperm: NONE SEEN
Trich, Wet Prep: NONE SEEN
WBC, Wet Prep HPF POC: >=10 — AB (ref <10)
Yeast Wet Prep HPF POC: NONE SEEN

## 2021-12-23 MED ORDER — METRONIDAZOLE 500 MG PO TABS: 500.0000 mg | ORAL_TABLET | Freq: Two times a day (BID) | ORAL | 0 refills | Status: DC

## 2021-12-23 NOTE — MAU Provider Note (Signed)
History     CSN: [id number]  Arrival date and time: 12/23/21 1523   None     No chief complaint on file.  HPI This is a 21 year old G1, P0 at 7 weeks and 3 days who presents with concerns of vaginal discharge.  She was recently seen in outside urgent care was told she had subchorionic hematoma.  She has not had any vaginal bleeding.  She denies pelvic pain. She denies sexual partners, IV drug use, trading sex for drugs.  Additionally, she relapsed into using fentanyl over the past 3 weeks after being sober for a year. She tried to get back on Suboxone, but got sick because she was not in withdrawal.  Her last use was this morning.  He has been taking his milligrams to stay out of withdrawal.  OB History     Gravida  1   Para      Term      Preterm      AB      Living         SAB      IAB      Ectopic      Multiple      Live Births              Past Medical History:  Diagnosis Date   Anxiety    Depression    Heroin abuse ([place])    Substance abuse ([place])     History reviewed. No pertinent surgical history.  Family History  Family history unknown: Yes    Social History   Tobacco Use   Smoking status: Every Day    Packs/day: 1.00    Years: 3.00    Total pack years: 3.00    Types: Cigarettes   Smokeless tobacco: Never  Vaping Use   Vaping Use: Never used  Substance Use Topics   Alcohol use: Not Currently    Alcohol/week: 1.0 standard drink of alcohol    Types: 1 Cans of beer per week    Comment: 1 beer one yr ago   Drug use: Yes    Types: Heroin, Marijuana, Methamphetamines, Fentanyl    Comment: xanax substance use 1xday for past few weeks    Allergies: No Known Allergies  Medications Prior to Admission  Medication Sig Dispense Refill Last Dose   acetaminophen (TYLENOL) 325 MG tablet Take 650 mg by mouth every 6 (six) hours as needed.   More than a month   fluconazole (DIFLUCAN) 200 MG tablet Take one dose by mouth, wait 72 hours, and  then take second dose by mouth (Patient not taking: Reported on 10/15/2020) 2 tablet 0 More than a month   metroNIDAZOLE (FLAGYL) 500 MG tablet Take 1 tablet (500 mg total) by mouth 2 (two) times daily. (Patient not taking: Reported on 10/15/2020) 14 tablet 0 More than a month   naloxone (NARCAN) 0.4 MG/ML injection Inject one mL for overdose of opiates (Patient not taking: Reported on 10/15/2020) 1 mL 1 More than a month   permethrin (ELIMITE) 5 % cream Apply cream from head to feet, leave on for 8 to 14 hours, then wash with soap/water, repeat application if living mites present 14 days after initial treatment (Patient not taking: Reported on 10/15/2020) 60 g 2 More than a month    Review of Systems Physical Exam   Blood pressure 114/63, pulse 97, temperature 98.9 F (37.2 C), resp. rate 18, height 5\' 8"  (1.727 m), weight 66.2 kg, last menstrual period  11/01/2021.  Physical Exam Vitals reviewed. Exam conducted with a chaperone present.  Constitutional:      Appearance: Normal appearance.  Cardiovascular:     Rate and Rhythm: Normal rate and regular rhythm.  Abdominal:     General: Abdomen is flat.     Palpations: Abdomen is soft.  Skin:    General: Skin is warm and dry.     Capillary Refill: Capillary refill takes less than 2 seconds.  Neurological:     General: No focal deficit present.     Mental Status: She is alert.  Psychiatric:        Mood and Affect: Mood normal.        Behavior: Behavior normal.        Thought Content: Thought content normal.        Judgment: Judgment normal.     MAU Course  Procedures Pt informed that the ultrasound is considered a limited OB ultrasound and is not intended to be a complete ultrasound exam.  Patient also informed that the ultrasound is not being completed with the intent of assessing for fetal or placental anomalies or any pelvic abnormalities.  Explained that the purpose of today's ultrasound is to assess for  viability.  Patient  acknowledges the purpose of the exam and the limitations of the study.    IUP with CRL 7wk 1 day, FHR 153. Small subchorionic hematoma seen.  MDM  Assessment and Plan   1. [redacted] weeks gestation of pregnancy   2. Opioid use disorder   3. BV (bacterial vaginosis)    Discharge to home. Can restart suboxone - start with 4-8mg  once a day, then increase to what she was taking before.  Flagyl for BV. Patient aware that GC/CT pending. She declined serum testing for STIs.  Levie Heritage 12/23/2021, 5:59 PM

## 2021-12-23 NOTE — Discharge Instructions (Signed)
You can restart the Suboxone once you are having 4-5 withdrawal symptoms. Some withdrawal symptoms include: Joint and body aches Shaking chills or sweating Anxiety Twitching, tremors, or shaking Goose bumps Restlessness, inability to sit still Heavy yawning Enlarged pupils Runny nose, tears in eyes Stomach cramps, nausea, vomiting, or diarrhea.  You may choose to start back with 1/2 of your tablets, then take the other half later that day. On the subsequent day, you can take a full dose.

## 2021-12-23 NOTE — MAU Note (Signed)
.  Judy Gibson is a 21 y.o. at Unknown here in MAU reporting: Was seen at [facility]  3 days ago and was told she had a subchorionic Hemorrhage    . Denies any bleeding but is reports a white/yellow vaginal discharge for the past 3 weeks and is concerned for an STD. Pt also stated she has been clean for a year and  she has recently relapsed used fentanyl. Wanting to get back on suboxin. LMP: 11/01/2021 Onset of complaint: 3 days Pain score: 3 Vitals:   12/23/21 1547  BP: 114/63  Pulse: 97  Resp: 18  Temp: 98.9 F (37.2 C)     FHT:n/a Lab orders placed from triage:  u/a

## 2021-12-24 ENCOUNTER — Telehealth: Payer: Self-pay

## 2021-12-24 ENCOUNTER — Telehealth (HOSPITAL_COMMUNITY): Payer: Self-pay

## 2021-12-24 LAB — GC/CHLAMYDIA PROBE AMP (~~LOC~~) NOT AT ARMC
Chlamydia: POSITIVE — AB
Comment: NEGATIVE
Comment: NORMAL
Neisseria Gonorrhea: NEGATIVE

## 2021-12-24 MED ORDER — AZITHROMYCIN 500 MG PO TABS: 1000.0000 mg | ORAL_TABLET | Freq: Once | ORAL | 0 refills | Status: AC

## 2021-12-24 NOTE — Telephone Encounter (Signed)
Dr Nehemiah Settle sent a message saying she wanted to come here for her pregnancy. I tried to call patient and got no answer. I left a voicemail for patient to call the office. Patient has ultrasound appointment at [facility] on 11/13 which is sooner than we can get her in. I was going to advise her to go to that appointment and if she still wanted to come here, she could transfer her records over here. mc

## 2022-02-11 ENCOUNTER — Inpatient Hospital Stay (HOSPITAL_COMMUNITY)
Admission: AD | Admit: 2022-02-11 | Discharge: 2022-02-11 | Disposition: A | Discharge status: Home / Self Care | Payer: Medicaid Other | Attending: Obstetrics & Gynecology | Admitting: Obstetrics & Gynecology

## 2022-02-11 DIAGNOSIS — O26892 Other specified pregnancy related conditions, second trimester: Secondary | ICD-10-CM | POA: Insufficient documentation

## 2022-02-11 DIAGNOSIS — M5459 Other low back pain: Secondary | ICD-10-CM | POA: Insufficient documentation

## 2022-02-11 DIAGNOSIS — Z3492 Encounter for supervision of normal pregnancy, unspecified, second trimester: Secondary | ICD-10-CM

## 2022-02-11 DIAGNOSIS — Z3A14 14 weeks gestation of pregnancy: Secondary | ICD-10-CM | POA: Diagnosis not present

## 2022-02-11 DIAGNOSIS — Y939 Activity, unspecified: Secondary | ICD-10-CM | POA: Diagnosis not present

## 2022-02-11 DIAGNOSIS — M545 Low back pain, unspecified: Secondary | ICD-10-CM | POA: Diagnosis not present

## 2022-02-11 LAB — WET PREP, GENITAL
Clue Cells Wet Prep HPF POC: NONE SEEN
Sperm: NONE SEEN
Trich, Wet Prep: NONE SEEN
WBC, Wet Prep HPF POC: >=10 — AB (ref <10)
Yeast Wet Prep HPF POC: NONE SEEN

## 2022-02-11 LAB — URINALYSIS, ROUTINE W REFLEX MICROSCOPIC
Bilirubin Urine: NEGATIVE
Glucose, UA: NEGATIVE mg/dL
Hgb urine dipstick: NEGATIVE
Ketones, ur: NEGATIVE mg/dL
Nitrite: NEGATIVE
Protein, ur: NEGATIVE mg/dL
Specific Gravity, Urine: 1.018 (ref 1.005–1.030)
pH: 6 (ref 5.0–8.0)

## 2022-02-11 MED ORDER — CYCLOBENZAPRINE HCL 5 MG PO TABS
5.0000 mg | ORAL_TABLET | Freq: Once | ORAL | Status: AC
  Administered 2022-02-11: 5 mg via ORAL
  Filled 2022-02-11: qty 1

## 2022-02-11 MED ORDER — ACETAMINOPHEN 325 MG PO TABS
650.0000 mg | ORAL_TABLET | Freq: Once | ORAL | Status: AC
  Administered 2022-02-11: 650 mg via ORAL
  Filled 2022-02-11: qty 2

## 2022-02-11 MED ORDER — ONDANSETRON 4 MG PO TBDP: 4.0000 mg | ORAL_TABLET | Freq: Three times a day (TID) | ORAL | 0 refills | Status: DC | PRN

## 2022-02-11 NOTE — MAU Provider Note (Signed)
Event Date/Time   First Provider Initiated Contact with Patient 02/11/22 1310      S [name] is a 21 y.o. G1P0 pregnant/non-pregnant female at [redacted]w[redacted]d who presents to MAU today with complaint of ***.   Receives care at ***. Prenatal records reviewed.  Pertinent items noted in HPI and remainder of comprehensive ROS otherwise negative.   O BP 116/66 (BP Location: Right Arm)   Pulse 88   Temp 98.1 F (36.7 C) (Oral)   Resp 15   Ht 5\' 8"  (1.727 m)   Wt 153 lb 3.2 oz (69.5 kg)   LMP 11/01/2021   SpO2 97%   BMI 23.29 kg/m  Physical Exam   MDM/MAU Course:  A @DX @ Medical screening exam {Blank single:19197::"complete","started"}  P Discharge from MAU in stable condition with *** precautions Follow up at *** as scheduled for ongoing prenatal care  Allergies as of 02/11/2022   No Known Allergies      Medication List     STOP taking these medications    metroNIDAZOLE 500 MG tablet Commonly known as: FLAGYL       TAKE these medications    acetaminophen 325 MG tablet Commonly known as: TYLENOL Take 650 mg by mouth every 6 (six) hours as needed.   ondansetron 4 MG disintegrating tablet Commonly known as: ZOFRAN-ODT Take 1 tablet (4 mg total) by mouth every 8 (eight) hours as needed for nausea or vomiting.        , 02/13/2022 02/11/2022 1:59 PM
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
## 2022-02-12 LAB — GC/CHLAMYDIA PROBE AMP (~~LOC~~) NOT AT ARMC
Chlamydia: NEGATIVE
Comment: NEGATIVE
Comment: NORMAL
Neisseria Gonorrhea: NEGATIVE

## 2022-05-30 DIAGNOSIS — F1990 Other psychoactive substance use, unspecified, uncomplicated: Secondary | ICD-10-CM | POA: Insufficient documentation

## 2022-08-09 NOTE — Progress Notes (Signed)
 Subjective:   HPI: Judy Gibson is a 22 y.o. female here with friend for leg swelling that started yesterday and has gradually gotten worse. Has happened once before while pregnant, but denies h/o pre-eclampsia. Denies SOB, CP, HA, dizziness, pain. Eating fasting food and not drinking much water. Relapsed on fentanyl  this past week and starts going to subutex clinic in 3 days. Last used today.   Chief Complaint  Patient presents with  . Swelling    Bilateral leg and feet swelling. States when she elevates her legs, the swelling goes down, but when she's laying flat her legs swell again. States the only time this has happened previously was when she was pregnant, denies any chance of pregnancy today.      Allergies:   Patient has no known allergies.  Current Medications:   Current Outpatient Medications  Medication Sig Dispense Refill  . buprenorphine HCL (SUBUTEX) 8 mg tablet Place 3 tablets (24 mg total) under the tongue.    . mirtazapine (REMERON SOL-TAB) 15 MG disintegrating tablet Dissolve 1 tablet (15 mg total) in the mouth nightly.     No current facility-administered medications for this visit.    Health Maintenance:   Health Maintenance  Topic Date Due  . Pneumococcal Vaccine 0-64 (1 of 2 - PCV) 03/29/2006  . HPV Vaccines (1 - 3-dose series) Never done  . COVID-19 Vaccine (2 - 2023-24 season) 10/25/2021  . DTaP/Tdap/Td Vaccines (7 - Td or Tdap) 04/30/2022  . Influenza Vaccine (Season Ended) 10/26/2022  . Gonorrhea Screening  01/30/2023  . Chlamydia Screening  01/30/2023  . Pap Smear with Reflex HPV (21-29)  01/09/2025  . Hepatitis C Screen  Completed    Past Medical/Surgical History:   Past Medical History:  Diagnosis Date  . Depression   . History of drug use    Clean for one year   No past surgical history on file.  Social History:   Social History   Tobacco Use  . Smoking status: Every Day    Current packs/day: 0.50    Types: Cigarettes     Passive exposure: Never  . Smokeless tobacco: Never  Vaping Use  . Vaping status: Never Used  Substance Use Topics  . Alcohol use: No  . Drug use: Not Currently    Types: Fentanyl , Methamphetamines     ROS:  Review of systems negative unless otherwise noted as per HPI.    Objective:   Vitals:   08/09/22 1242  BP: 118/64  Pulse: 66  Resp: 18  Temp: 35.8 C (96.4 F)  TempSrc: Tympanic  SpO2: 98%  Weight: 66.7 kg (147 lb)  Height: 172.7 cm (5' 8)    Body mass index is 22.35 kg/m.  Physical Exam Constitutional:      Appearance: Normal appearance.  HENT:     Head: Normocephalic.  Cardiovascular:     Rate and Rhythm: Normal rate and regular rhythm.     Pulses: Normal pulses.     Heart sounds: Murmur heard.     No friction rub.  Pulmonary:     Effort: Pulmonary effort is normal. No respiratory distress.     Breath sounds: No wheezing, rhonchi or rales.  Chest:     Chest wall: No tenderness.  Musculoskeletal:        General: Swelling and tenderness present. No deformity.     Cervical back: Normal range of motion and neck supple.     Right lower leg: Edema present.     Left  lower leg: Edema present.  Lymphadenopathy:     Cervical: No cervical adenopathy.  Skin:    General: Skin is warm and dry.     Capillary Refill: Capillary refill takes less than 2 seconds.     Findings: No erythema, lesion or rash.  Neurological:     Mental Status: She is alert and oriented to person, place, and time.     Gait: Gait normal.  Psychiatric:        Behavior: Behavior normal.      Assessment and Plan:   Lymphedema Lymphedema, new, unstable. Discussed possible causes including hormones post pregnancy, physiologic, infection/intravenous drug use, cardiomyopathy/CHF. Encouraged to cut back on salt intake, increase water intake, and start compression socks. Advised to schedule with PCP for labs and referral to cardiology. Should consider lymphedema clinic/PT.   Murmur Murmur,  new, unstable. Advised that should schedule with PCP to get referral to cardio. Advised that murmur and lymphedema could be related, but also murmur could be indirectly related to fentanyl  use.   Barriers to recommended plan: None identified  Return in about 4 weeks (around 09/06/2022) for PCP.   Tishira R Brewington-Williams, PA-C  Note - This record has been created using [employer]. Chart creation errors have been sought, but may not always have been located. Such creation errors do not reflect on the standard of medical care.

## 2022-09-16 IMAGING — US US BREAST*L* LIMITED INC AXILLA
1 series · 14 of 17 positions shown · non-contrast
Comparison: None.

CLINICAL DATA: The patient presents with a history of a left breast
abscess for 3 weeks.

EXAM:
ULTRASOUND OF THE LEFT BREAST

[Series 1: us breast ltd uni left inc axilla · 17 acquisitions, 14 frames shown]
[im 1/17]
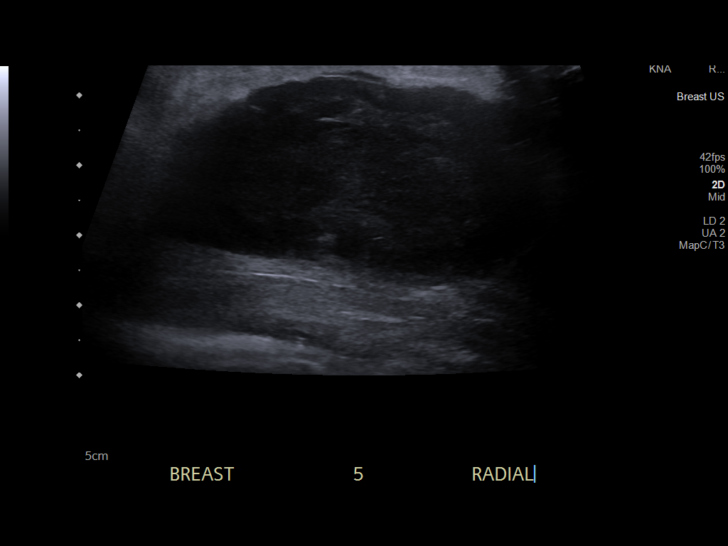
[im 2/17]
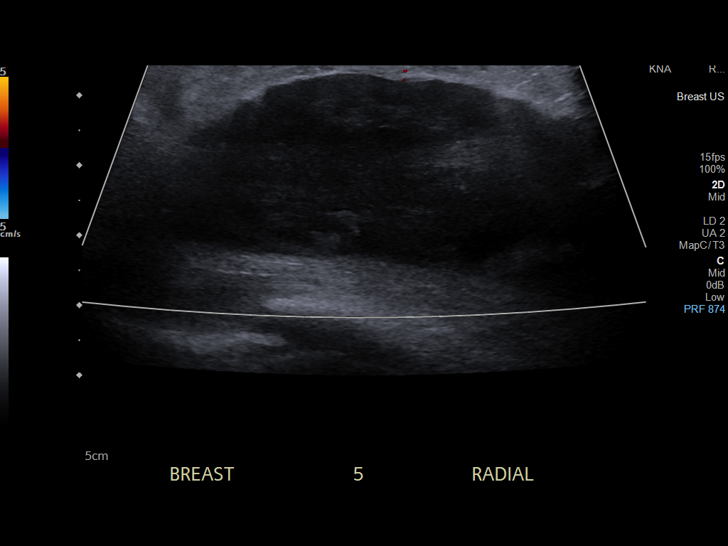
[im 4/17]
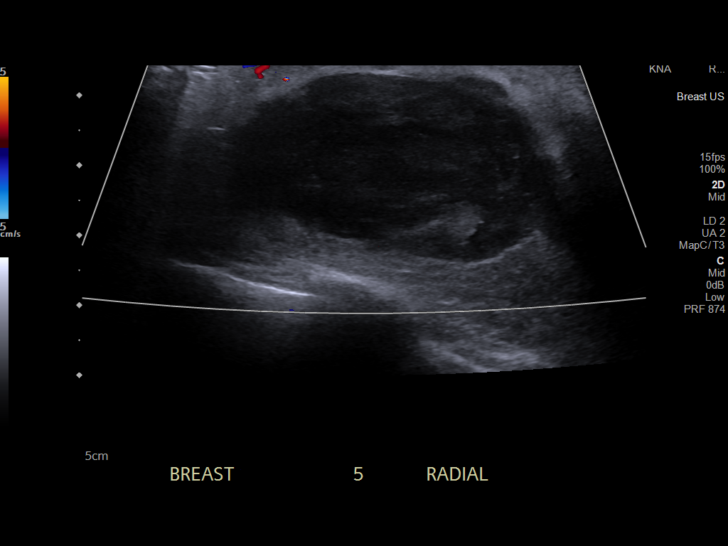
[im 5/17]
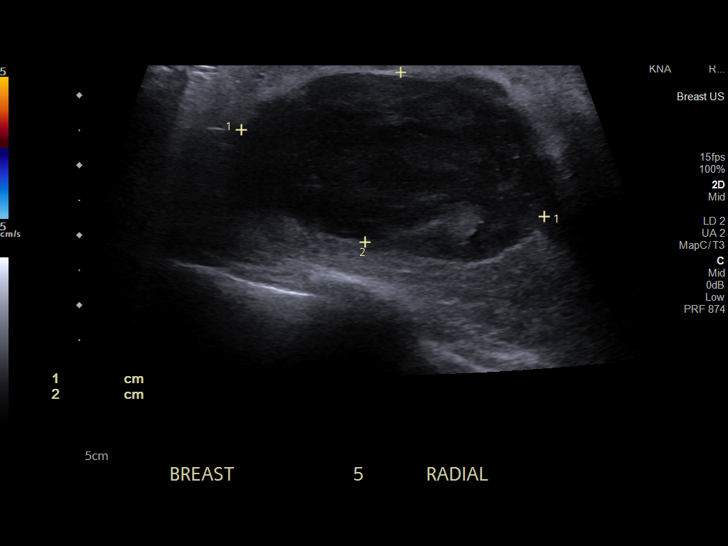
[im 6/17]
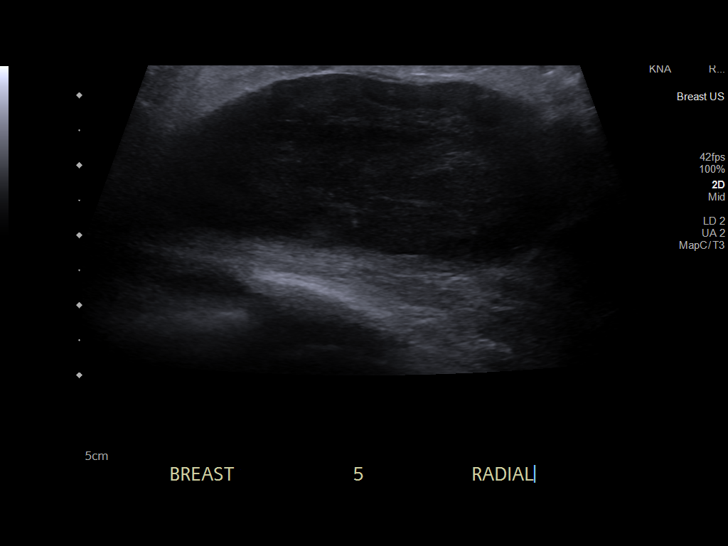
[im 7/17]
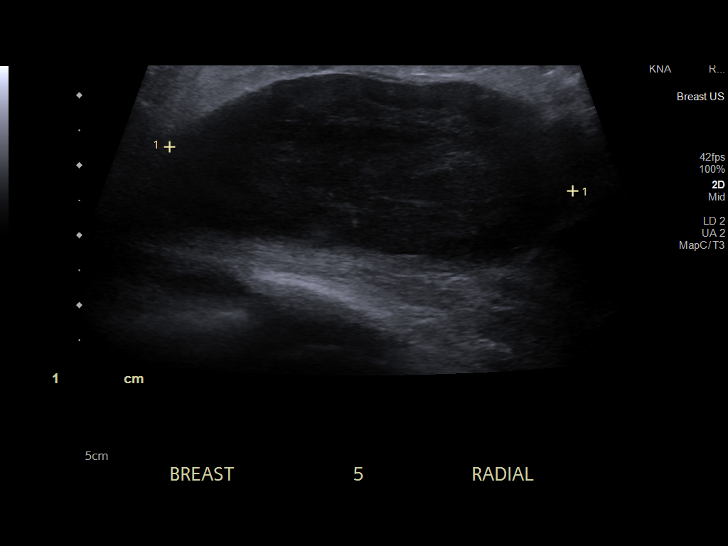
[im 8/17]
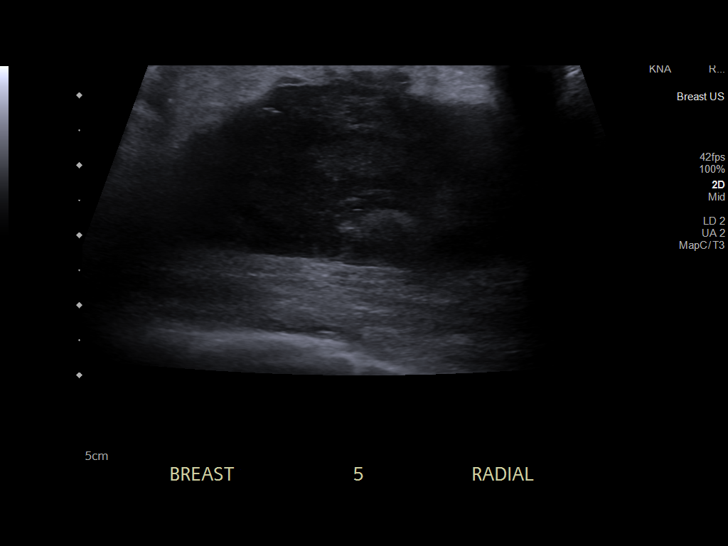
[im 10/17]
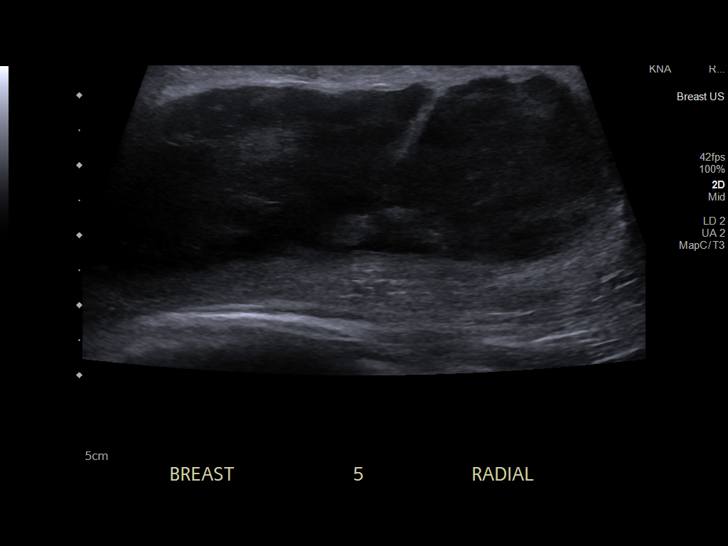
[im 11/17]
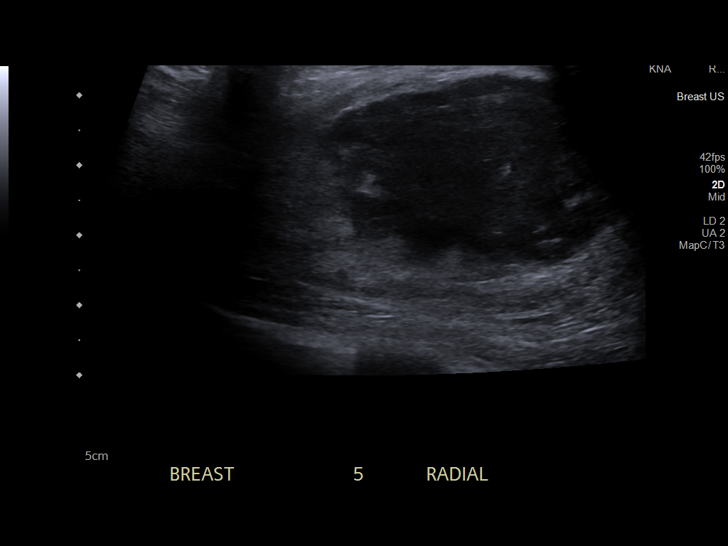
[im 12/17]
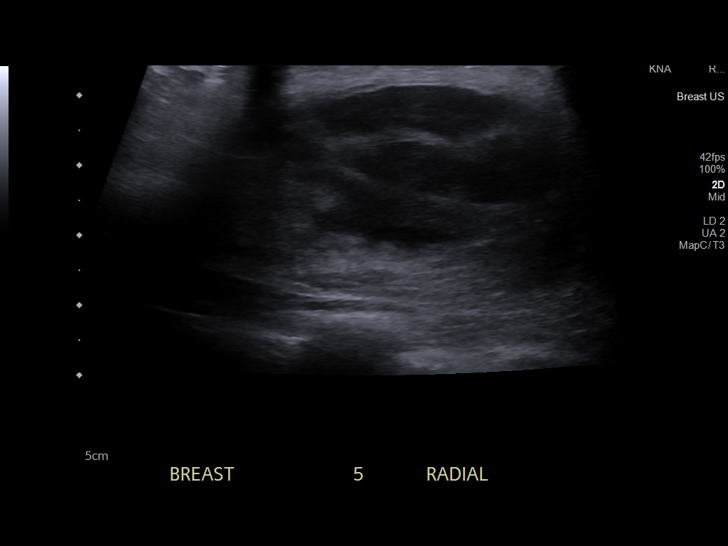
[im 13/17]
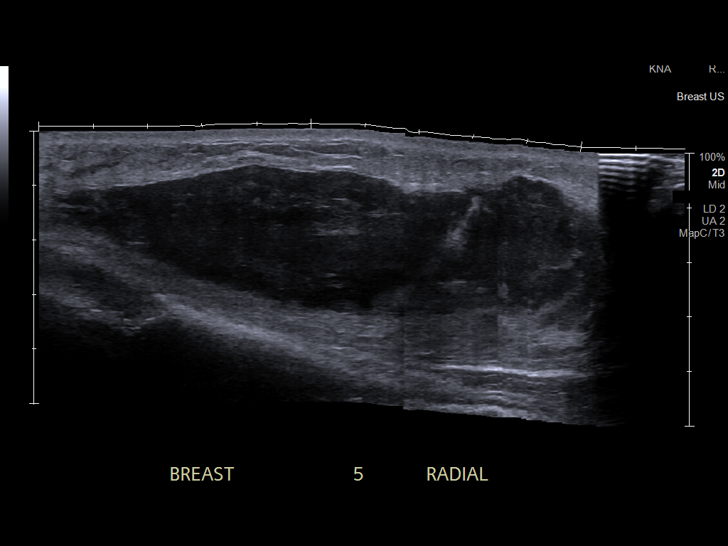
[im 14/17]
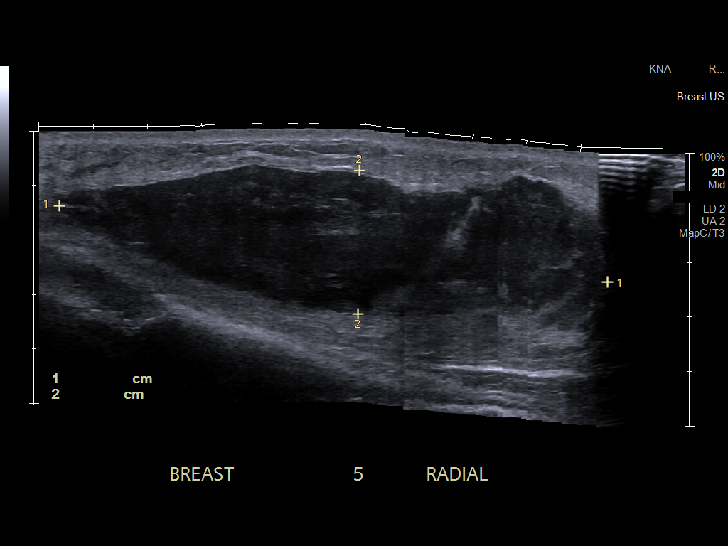
[im 16/17]
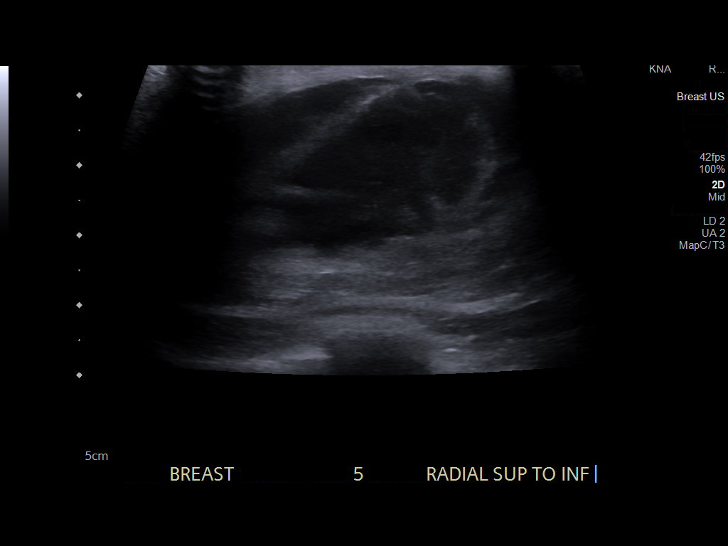
[im 17/17]
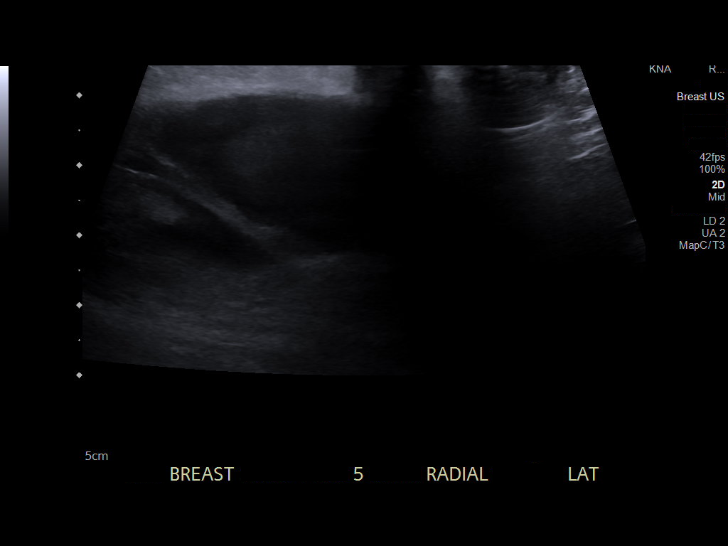

[14 of 17 positions shown; findings below may reference images not displayed]

FINDINGS: Targeted ultrasound is performed, showing either a mass or a
masslike collection in the left breast between 10 and 11 o'clock 5
cm from the nipple measuring 10.1 x 2.6 by 4.5 cm. No associated
hyperemia is identified.
IMPRESSION: The patient has either a hypoechoic mass or a masslike collection
such as an abscess in the left breast between 10-11 o'clock, 5 cm
from the nipple measuring up to 10.1 cm.

RECOMMENDATION:
Recommend referring the patient to [REDACTED] for further
evaluation. The patient should be sent to [REDACTED] with
orders for an ultrasound and both an aspiration and biopsy so she
may be treated appropriately based on findings at the time. If the
patient has signs of infection, she may be treated with antibiotics
today in the emergency room prior to referral to [REDACTED].

BI-RADS CATEGORY  3: Probably benign.

## 2023-07-07 ENCOUNTER — Emergency Department (HOSPITAL_COMMUNITY)
Admission: EM | Admit: 2023-07-07 | Discharge: 2023-07-08 | Discharge status: Against Medical Advice | Payer: Self-pay | Attending: Emergency Medicine | Admitting: Emergency Medicine

## 2023-07-07 ENCOUNTER — Encounter (HOSPITAL_COMMUNITY): Payer: Self-pay

## 2023-07-07 ENCOUNTER — Other Ambulatory Visit: Payer: Self-pay

## 2023-07-07 DIAGNOSIS — K0889 Other specified disorders of teeth and supporting structures: Secondary | ICD-10-CM | POA: Insufficient documentation

## 2023-07-07 DIAGNOSIS — Z5321 Procedure and treatment not carried out due to patient leaving prior to being seen by health care provider: Secondary | ICD-10-CM | POA: Insufficient documentation

## 2023-07-07 NOTE — ED Triage Notes (Signed)
 Left lower dental pain that started yesterday, pt states she has had issues with the tooth for a while. Denies drainage, c/o fever, chills, body aches.

## 2023-07-08 NOTE — ED Notes (Signed)
Pt called for room assignment no answer 

## 2023-07-10 NOTE — ED Provider Notes (Signed)
 ------------------------------------------------------------------------------- Attestation signed by Lynwood JAYSON Life, MD at 07/10/23 [phone] I have reviewed and agree with the APP's findings and plan for this patient. Lynwood JAYSON Life, MD Emergency Department - 07/10/2023 6:55 PM  -------------------------------------------------------------------------------   [name]  ED Provider Note  Judy Gibson 23 y.o. female DOB: 2000/07/16 MRN: [id number] History   Chief Complaint  Patient presents with  . Facial Swelling    Pt with left sided facial swelling and pain that started last night - Pt reports hx of tooth problems on same size - pt appears in NAD at triage    Patient is a 23yoF who presents to the ED today with dental pain and swelling. States that she has had previous issues with her teeth, including dental infections. She noticed yesterday she started to experience L sided lower dental pain along with some swelling to her lower jaw which seems to be centered around a broken tooth on the L lower jaw. She has been taking tylenol  and ibuprofen  without improvement. She called her dentist's after hours number and as they are unable to see her until next week at the earliest she was advised to come to the ED or antibiotics.   Facial Swelling      History reviewed. No pertinent past medical history.  No past surgical history on file.  Social History   Substance and Sexual Activity  Alcohol Use None   Social History   Tobacco Use  Smoking Status Not on file  Smokeless Tobacco Not on file   E-Cigarettes  . Vaping Use    . Start Date    . Cartridges/Day    . Quit Date     Social History   Substance and Sexual Activity  Drug Use Not on file         No Known Allergies  Discharge Medication List as of 07/08/2023 12:16 AM     CONTINUE these medications which have NOT CHANGED   Details  Prenatal Vit-Fe Fumarate-FA (PRENATAL  COMPLETE) 14-0.4 MG TABS Take 1 tablet by mouth daily., Starting Sat 12/21/2021, Normal        Primary Survey  Primary Survey  Review of Systems   Review of Systems  Constitutional:  Negative for chills and fever.  HENT:  Positive for dental problem and facial swelling. Negative for sore throat and trouble swallowing.   All other systems reviewed and are negative.   Physical Exam   ED Triage Vitals [07/07/23 2344]  BP 105/69  Heart Rate 84  Resp 18  SpO2 100 %  Temp 98.2 F (36.8 C)    Physical Exam  Constitutional: She appears well-developed and well-nourished. She is in good hygiene. She has good hygiene. She does not appear distressed, does not appear ill and no respiratory distress. Not diaphoretic. HENT:  Head: Normocephalic and atraumatic.    Right Ear: Normal external ear.  Left Ear: Normal external ear.  Nose: Nose normal.  Mouth/Throat: No sublingual induration. No dental abscesses.   No trismus not present in the jaw. Voice normal.  Slight facial swelling as above. Broken tooth as above.  Eyes: EOM are intact. Pupils are equal, round, and reactive to light. Right eye: no drainage. no conjunctival injection. Left eye: no drainage. no conjunctival injection. No scleral icterus.  Neck: Normal range of motion and voice normal. Normal range of motion.  Cardiovascular: Normal rate.  Pulmonary/Chest: No respiratory distress. Not tachypneic. Respiratory effort normal.  Musculoskeletal: Normal range of motion.  Cervical back: Normal range of motion. Normal range of motion.   Neurological: She is alert and oriented to person, place, and time. Gait normal. She has normal speech. No facial weakness.Gait normal. GCS Total Score = 15.  , eye opening = 4 , verbal response = 5 , best motor response = 6 , pupil unreactive to light = 0  no facial droop.  Skin: Skin is not clammy. Skin is warm. Not diaphoretic. Skin is dry. No rash noted. No pallor.  No cyanosis.  No  obvious rash, abrasions/lacerations or lesions to exposed areas of skin.   Psychiatric: She has a normal mood and affect. Her speech is normal. Her behavior is normal.     ED Course   Lab results: No data to display  Imaging: No data to display    ECG: ECG Results   None                                                                     Pre-Sedation Procedures    Medical Decision Making Patient is a 23yoF who presents to the ED today with dental pain and swelling. Considered my differential is dental caries, tooth fracture, tooth avulsion, periapical abscess, periodontal abscess, peritonsillar abscess, retropharyngeal abscess or Ludwig's angina.  No erythema chills edema or evidence of airway involvement.  No sublingual edema or evidence of Ludwig's angina.  No abscess formation.  No facial cellulitis or herpetic lesions.  No facial trauma.  No malocclusion.  No jaw dislocation.  EOMs intact without discomfort, no evidence of retrobulbar abscess.  Patient is tolerating by mouth fluids. Will start her on a course of antibiotics and give her a dose of Toradol  per request in the emergency department.  Advised that she follow-up closely with her dentist for further evaluation and management.  Given strict return precautions regarding worsening symptoms.  Patient voiced understanding and is safe for discharge at this time.  Patient has been reexamined and is ready to be discharged.  All diagnostic results have been reviewed and discussed with the patient.  After careful consideration review of the work-up completed today in the setting of patient's known medical problems, presenting problems, etc., I do feel that patient is safe for discharge at this time and is appropriate for outpatient management as opposed to hospitalization.  Care plan has been outlined with patient understands all current diagnoses, results and treatment plans.  There are no new  complaints, changes, or physical findings at this time.  All questions have been addressed.  All medications were reviewed with the patient.  The patient has been instructed to and agrees to follow-up with dentistry, as well as return to the ED upon further deterioration.   Problems Addressed: Dental infection: acute illness or injury  Risk Prescription drug management. Parenteral controlled substances.           Provider Communication  Discharge Medication List as of 07/08/2023 12:16 AM     START taking these medications   Details  amoxicillin-clavulanate (AUGMENTIN) 875-125 mg per tablet Take one tablet by mouth every 12 (twelve) hours for 10 days., Starting Wed 07/08/2023, Until Sat 07/18/2023, Normal        Discharge Medication List as of 07/08/2023 12:16 AM  Discharge Medication List as of 07/08/2023 12:16 AM      Clinical Impression Final diagnoses:  Dental infection    ED Disposition     ED Disposition  Discharge   Condition  Stable   Comment  --                 Follow-up Information     [facility] Emergency Department.   Specialty: Emergency Medicine Comments: If symptoms worsen Contact information: [address] [facility] [place]  [phone]                 Electronically signed by:    Charmaine FORBES Blush, PA-C 07/10/23 1129

## 2023-09-02 ENCOUNTER — Emergency Department (HOSPITAL_COMMUNITY)
Admission: EM | Admit: 2023-09-02 | Discharge: 2023-09-02 | Disposition: A | Discharge status: Home / Self Care | Payer: Self-pay

## 2023-09-02 ENCOUNTER — Emergency Department (HOSPITAL_COMMUNITY): Payer: Self-pay

## 2023-09-02 ENCOUNTER — Encounter (HOSPITAL_COMMUNITY): Payer: Self-pay | Admitting: Emergency Medicine

## 2023-09-02 DIAGNOSIS — Z23 Encounter for immunization: Secondary | ICD-10-CM | POA: Insufficient documentation

## 2023-09-02 DIAGNOSIS — R519 Headache, unspecified: Secondary | ICD-10-CM | POA: Diagnosis not present

## 2023-09-02 DIAGNOSIS — Y92410 Unspecified street and highway as the place of occurrence of the external cause: Secondary | ICD-10-CM | POA: Insufficient documentation

## 2023-09-02 DIAGNOSIS — S0992XA Unspecified injury of nose, initial encounter: Secondary | ICD-10-CM | POA: Diagnosis present

## 2023-09-02 DIAGNOSIS — S022XXA Fracture of nasal bones, initial encounter for closed fracture: Secondary | ICD-10-CM | POA: Insufficient documentation

## 2023-09-02 LAB — CBC
HCT: 38.8 % (ref 36.0–46.0)
Hemoglobin: 12.6 g/dL (ref 12.0–15.0)
MCH: 27.7 pg (ref 26.0–34.0)
MCHC: 32.5 g/dL (ref 30.0–36.0)
MCV: 85.3 fL (ref 80.0–100.0)
Platelets: 293 K/uL (ref 150–400)
RBC: 4.55 MIL/uL (ref 3.87–5.11)
RDW: 12.4 % (ref 11.5–15.5)
WBC: 6.5 K/uL (ref 4.0–10.5)
nRBC: 0 % (ref 0.0–0.2)

## 2023-09-02 LAB — I-STAT CHEM 8, ED
BUN: 16 mg/dL (ref 6–20)
Calcium, Ion: 1.22 mmol/L (ref 1.15–1.40)
Chloride: 100 mmol/L (ref 98–111)
Creatinine, Ser: 0.9 mg/dL (ref 0.44–1.00)
Glucose, Bld: 99 mg/dL (ref 70–99)
HCT: 36 % (ref 36.0–46.0)
Hemoglobin: 12.2 g/dL (ref 12.0–15.0)
Potassium: 4.1 mmol/L (ref 3.5–5.1)
Sodium: 140 mmol/L (ref 135–145)
TCO2: 27 mmol/L (ref 22–32)

## 2023-09-02 LAB — COMPREHENSIVE METABOLIC PANEL WITH GFR
ALT: 31 U/L (ref 0–44)
AST: 29 U/L (ref 15–41)
Albumin: 3.5 g/dL (ref 3.5–5.0)
Alkaline Phosphatase: 78 U/L (ref 38–126)
Anion gap: 13 (ref 5–15)
BUN: 14 mg/dL (ref 6–20)
CO2: 27 mmol/L (ref 22–32)
Calcium: 9.8 mg/dL (ref 8.9–10.3)
Chloride: 98 mmol/L (ref 98–111)
Creatinine, Ser: 0.91 mg/dL (ref 0.44–1.00)
GFR, Estimated: >60 mL/min (ref >60)
Glucose, Bld: 99 mg/dL (ref 70–99)
Potassium: 4 mmol/L (ref 3.5–5.1)
Sodium: 138 mmol/L (ref 135–145)
Total Bilirubin: 0.5 mg/dL (ref 0.0–1.2)
Total Protein: 7.7 g/dL (ref 6.5–8.1)

## 2023-09-02 LAB — I-STAT CG4 LACTIC ACID, ED: Lactic Acid, Venous: 1 mmol/L (ref 0.5–1.9)

## 2023-09-02 LAB — URINALYSIS, ROUTINE W REFLEX MICROSCOPIC
Bilirubin Urine: NEGATIVE
Glucose, UA: NEGATIVE mg/dL
Hgb urine dipstick: NEGATIVE
Ketones, ur: NEGATIVE mg/dL
Leukocytes,Ua: NEGATIVE
Nitrite: NEGATIVE
Protein, ur: NEGATIVE mg/dL
Specific Gravity, Urine: 1.028 (ref 1.005–1.030)
pH: 7 (ref 5.0–8.0)

## 2023-09-02 LAB — SAMPLE TO BLOOD BANK

## 2023-09-02 LAB — ETHANOL: Alcohol, Ethyl (B): <15 mg/dL (ref <15)

## 2023-09-02 LAB — PROTIME-INR
INR: 1.1 (ref 0.8–1.2)
Prothrombin Time: 14.5 s (ref 11.4–15.2)

## 2023-09-02 LAB — HCG, SERUM, QUALITATIVE: Preg, Serum: NEGATIVE

## 2023-09-02 MED ORDER — ACETAMINOPHEN 325 MG PO TABS
650.0000 mg | ORAL_TABLET | Freq: Once | ORAL | Status: AC
  Administered 2023-09-02: 650 mg via ORAL
  Filled 2023-09-02: qty 2

## 2023-09-02 MED ORDER — IOHEXOL 350 MG/ML SOLN
100.0000 mL | Freq: Once | INTRAVENOUS | Status: AC | PRN
  Administered 2023-09-02: 100 mL via INTRAVENOUS

## 2023-09-02 MED ORDER — ONDANSETRON HCL 4 MG/2ML IJ SOLN
4.0000 mg | Freq: Once | INTRAMUSCULAR | Status: AC
  Administered 2023-09-02: 4 mg via INTRAVENOUS
  Filled 2023-09-02: qty 2

## 2023-09-02 MED ORDER — TETANUS-DIPHTH-ACELL PERTUSSIS 5-2.5-18.5 LF-MCG/0.5 IM SUSY
0.5000 mL | PREFILLED_SYRINGE | Freq: Once | INTRAMUSCULAR | Status: AC
  Administered 2023-09-02: 0.5 mL via INTRAMUSCULAR
  Filled 2023-09-02: qty 0.5

## 2023-09-02 NOTE — ED Notes (Signed)
 CCMD contacted
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
## 2023-09-02 NOTE — ED Notes (Signed)
 Patient transported to CT

## 2023-09-02 NOTE — Discharge Instructions (Addendum)
 The CT of your face did show bilateral nasal bones fractures, please avoid blowing your nose and sleep slightly elevated from your bed.  You may call ENT in order to obtain further follow up.

## 2023-10-26 ENCOUNTER — Other Ambulatory Visit: Payer: Self-pay

## 2023-10-26 ENCOUNTER — Ambulatory Visit
Admission: EM | Admit: 2023-10-26 | Discharge: 2023-10-26 | Disposition: A | Discharge status: Home / Self Care | Payer: MEDICAID | Attending: Physician Assistant | Admitting: Physician Assistant

## 2023-10-26 DIAGNOSIS — Z3201 Encounter for pregnancy test, result positive: Secondary | ICD-10-CM

## 2023-10-26 DIAGNOSIS — O2341 Unspecified infection of urinary tract in pregnancy, first trimester: Secondary | ICD-10-CM | POA: Diagnosis not present

## 2023-10-26 DIAGNOSIS — Z3A01 Less than 8 weeks gestation of pregnancy: Secondary | ICD-10-CM | POA: Insufficient documentation

## 2023-10-26 DIAGNOSIS — N390 Urinary tract infection, site not specified: Secondary | ICD-10-CM | POA: Diagnosis not present

## 2023-10-26 DIAGNOSIS — N898 Other specified noninflammatory disorders of vagina: Secondary | ICD-10-CM | POA: Diagnosis not present

## 2023-10-26 DIAGNOSIS — R109 Unspecified abdominal pain: Secondary | ICD-10-CM | POA: Diagnosis present

## 2023-10-26 LAB — POCT URINE DIPSTICK
Blood, UA: NEGATIVE
Glucose, UA: NEGATIVE mg/dL
Nitrite, UA: POSITIVE — AB
POC PROTEIN,UA: 30 — AB
Spec Grav, UA: 1.025 (ref 1.010–1.025)
Urobilinogen, UA: 0.2 U/dL
pH, UA: 6 (ref 5.0–8.0)

## 2023-10-26 LAB — POCT URINE PREGNANCY: Preg Test, Ur: POSITIVE — AB

## 2023-10-26 MED ORDER — DOXYLAMINE-PYRIDOXINE 10-10 MG PO TBEC: 1.0000 | DELAYED_RELEASE_TABLET | Freq: Every evening | ORAL | 0 refills | Status: DC | PRN

## 2023-10-26 MED ORDER — CEPHALEXIN 500 MG PO CAPS: 500.0000 mg | ORAL_CAPSULE | Freq: Four times a day (QID) | ORAL | 0 refills | Status: AC

## 2023-10-26 NOTE — Discharge Instructions (Addendum)
 You were seen today for concerns of pregnancy.  Your urine pregnancy test was positive. At this time I recommend that you start taking a prenatal vitamin with folic acid to help make sure that your fetus develops appropriately without neural tube defects. Please do not consume alcohol or any recreational drugs as these can threaten your pregnancy.  I have included a list of over-the-counter medications that are considered safe to use during pregnancy in your paperwork. To help with your morning sickness I am sending in a medication called Diclegis .  This is a mixture of doxylamine  10 mg and paroxetine, B6 10 mg that helps with pregnancy associated nausea and vomiting.  You can take this at night to assist with your symptoms. Please make sure that she follow-up with an OB/GYN to help monitor your pregnancy and make sure that you are progressing without complications.  If you develop any of the following symptoms please go to the emergency room: Severe headache, swelling in 1 side of the body, chest pain, difficulty breathing, severe abdominal pain or cramping, vaginal bleeding  Based on your symptoms and results of the urinalysis I believe you have a UTI I recommend the following:  I have sent in a script for keflex  500 mg to be taken by mouth every 6 hours for 5 days.  Please finish the entire course of the antibiotic even if you are feeling better before it is completed unless you develop an allergic reaction or are told by a medical provider to stop taking it. We have sent a sample of your urine off for a urine culture.  This will help us  determine what bacteria is causing your symptoms as well as the most appropriate antibiotic to treat it.  If we need to make any adjustments to your medication regimen and new medication will be sent to the pharmacy on file and you will be updated via phone call in MyChart. Stay well hydrated (at least 75 oz of water per day) and avoid holding your urine for prolonged  periods of time. If you have any of the following please return to urgent care or go to the emergency room: Persistent symptoms, fever, trouble urinating or inability to urinate, confusion, flank pain.    [facility] Address: [address], [place], [place] [address] Phone: [phone]  [facility] for [facility] Address: [address], [place], [place] [address] Phone: [phone]

## 2023-10-26 NOTE — ED Triage Notes (Signed)
 Pt presents to urgent care with possible pregnancy. States she is having morning sickness, lower abdominal cramping, and bilateral breast tenderness. The following symptoms have been present for about three days. LMP was 7/28. Currently rates overall pain a 4/10. Denies vaginal bleeding. Pt shares she had a stillborn in 2024, still producing breast milk from this pregnancy.

## 2023-10-26 NOTE — ED Provider Notes (Signed)
 [facility]    CSN: [id number] Arrival date & time: 10/26/23  1256      History   Chief Complaint Chief Complaint  Patient presents with   Possible Pregnancy    HPI Judy Gibson is a 23 y.o. female.   HPI  Patient is here today for confirmation of pregnancy.  She reports having morning sickness, lower abdominal cramping and bilateral breast tenderness for the past 3 days.  She reports her LMP was 09/21/2023 and denies vaginal bleeding.She reports having a miscarriage last year due to not getting Rho gam shot - states she is O negative blood type.  She states she was not planning on becoming pregnant and is not taking a prenatal vitamin She reports having some hot flashes but denies fever.  She is concerned for vaginal discharge and would like STD testing today. 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 No pertinent surgical history.  OB History     Gravida  1   Para      Term      Preterm      AB      Living         SAB      IAB      Ectopic      Multiple      Live Births               Home Medications    Prior to Admission medications   Medication Sig Start Date End Date Taking? Authorizing Provider  cephALEXin  (KEFLEX ) 500 MG capsule Take 1 capsule (500 mg total) by mouth 4 (four) times daily for 5 days. 10/26/23 10/31/23 Yes Roxie Gueye E, PA-C  Doxylamine -Pyridoxine  10-10 MG TBEC Take 1 tablet by mouth at bedtime as needed. 10/26/23  Yes Keidan Aumiller E, PA-C  citalopram   (CELEXA ) 10 MG tablet Take 1 tablet (10 mg total) by mouth daily. 07/17/18 11/25/18  Wonda Clarita BRAVO, NP    Family History Family History  Family history unknown: Yes    Social History Social History   Tobacco Use   Smoking status: Every Day    Current packs/day: 1.00    Average packs/day: 1 pack/day for 3.0 years (3.0 ttl pk-yrs)    Types: Cigarettes   Smokeless tobacco: Never  Vaping Use   Vaping status: Never Used  Substance Use Topics   Alcohol use: Not Currently    Alcohol/week: 1.0 standard drink of alcohol    Types: 1 Cans of beer per week    Comment: 1 beer one yr ago   Drug use: Yes    Types: Heroin, Marijuana, Methamphetamines, Fentanyl     Comment: xanax substance use 1xday for past few weeks     Allergies   Patient has no known allergies.   Review of Systems Review of Systems  Constitutional:  Negative for chills and fever.  Respiratory:  Negative for shortness of breath and wheezing.   Gastrointestinal:  Positive for nausea and vomiting.  Genitourinary:  Positive for vaginal discharge. Negative for vaginal bleeding and vaginal pain.     Physical Exam Triage Vital Signs ED Triage Vitals  Encounter Vitals Group     BP 10/26/23 1337 128/75     Girls Systolic BP Percentile --      Girls Diastolic BP Percentile --      Boys Systolic BP Percentile --      Boys Diastolic BP Percentile --      Pulse Rate 10/26/23 1337 (!) 115     Resp 10/26/23 1337 16     Temp 10/26/23 1337 98.1 F (36.7 C)     Temp Source 10/26/23 1337 Oral     SpO2 10/26/23 1337 95 %     Weight 10/26/23 1507 125 lb (56.7 kg)     Height --      Head Circumference --      Peak Flow --      Pain Score 10/26/23 1505 4     Pain Loc --      Pain Education --      Exclude from Growth Chart --    No data found.  Updated Vital Signs BP 128/75 (BP Location: Right Arm)   Pulse (!) 115 Comment: Dark nail polish and fake nails. Hard for the pulse ox to pick up.  Temp 98.1 F (36.7 C)  (Oral)   Resp 16   Wt 125 lb (56.7 kg)   LMP 09/21/2023 (Approximate)   SpO2 95% Comment: Dark nail polish and fake nails. Hard for the pulse ox to pick up.  Breastfeeding No   BMI 19.01 kg/m   Visual Acuity Right Eye Distance:   Left Eye Distance:   Bilateral Distance:    Right Eye Near:   Left Eye Near:    Bilateral Near:     Physical Exam Vitals reviewed.  Constitutional:      General: She is awake.     Appearance: Normal appearance. She is well-developed and well-groomed.  HENT:     Head: Normocephalic and atraumatic.  Eyes:     General: Lids are normal. Gaze aligned appropriately.     Extraocular Movements: Extraocular movements intact.     Conjunctiva/sclera: Conjunctivae normal.  Pulmonary:     Effort: Pulmonary effort is normal.  Neurological:     Mental Status: She is alert and oriented to person, place, and time.  Psychiatric:        Attention and Perception: Attention and perception normal.        Mood and Affect: Mood and affect normal.        Speech: Speech normal.        Behavior: Behavior normal. Behavior is cooperative.      UC Treatments / Results  Labs (all labs ordered are listed, but only abnormal results are displayed) Labs Reviewed  POCT URINE PREGNANCY - Abnormal; Notable for the following components:      Result Value   Preg Test, Ur Positive (*)    All other components within normal limits  POCT URINE DIPSTICK - Abnormal; Notable for the following components:   Color, UA straw (*)    Clarity, UA cloudy (*)    Bilirubin, UA small (*)    Ketones, POC UA small (15) (*)    POC PROTEIN,UA =30 (*)    Nitrite, UA Positive (*)    Leukocytes, UA Small (1+) (*)    All other components within normal limits  URINE CULTURE  CERVICOVAGINAL ANCILLARY ONLY  EKG   Radiology No results found.  Procedures Procedures (including critical care time)  Medications Ordered in UC Medications - No data to display  Initial Impression /  Assessment and Plan / UC Course  I have reviewed the triage vital signs and the nursing notes.  Pertinent labs & imaging results that were available during my care of the patient were reviewed by me and considered in my medical decision making (see chart for details).      Final Clinical Impressions(s) / UC Diagnoses   Final diagnoses:  Pregnancy test positive  Vaginal discharge  Urinary tract infection without hematuria, site unspecified  Pt presents today for concern of pregnancy. She reports that her LMP was 09/21/23 and she has been having nausea  with vomiting, lower abdominal cramping and breast tenderness for the past 3 days.  Urine pregnancy test was positive today.  Urine dip was notable for leukocytes, nitrites as well as ketones and protein.  At this time suspect UTI.  Will start Keflex  500 mg PO QID x 5 days pending urine culture results.  Patient also has concerns for vaginal discharge changes.  Will get cervicovaginal swab to assess for BV, trichomoniasis, yeast, gonorrhea, chlamydia.  Results to dictate further management.  Reviewed with patient that she should start a prenatal vitamin with plenty of folic acid to help prevent neural tube defects.  Reviewed that she should refrain from alcohol or recreational drug use and provided patient with appropriate OTC medications to use during pregnancy attached to AVS.  Will send patient in a prescription for Diclegis  to assist with nausea and vomiting.  Contact information for local OB/GYN offices provided in AVS for patient to get established for routine pregnancy monitoring.  ED and return precautions reviewed and provided in AVS.  Follow-up with Ob/GYN for ongoing management.     Discharge Instructions      You were seen today for concerns of pregnancy.  Your urine pregnancy test was positive. At this time I recommend that you start taking a prenatal vitamin with folic acid to help make sure that your fetus develops appropriately  without neural tube defects. Please do not consume alcohol or any recreational drugs as these can threaten your pregnancy.  I have included a list of over-the-counter medications that are considered safe to use during pregnancy in your paperwork. To help with your morning sickness I am sending in a medication called Diclegis .  This is a mixture of doxylamine  10 mg and paroxetine, B6 10 mg that helps with pregnancy associated nausea and vomiting.  You can take this at night to assist with your symptoms. Please make sure that she follow-up with an OB/GYN to help monitor your pregnancy and make sure that you are progressing without complications.  If you develop any of the following symptoms please go to the emergency room: Severe headache, swelling in 1 side of the body, chest pain, difficulty breathing, severe abdominal pain or cramping, vaginal bleeding  Based on your symptoms and results of the urinalysis I believe you have a UTI I recommend the following:  I have sent in a script for keflex  500 mg to be taken by mouth every 6 hours for 5 days.  Please finish the entire course of the antibiotic even if you are feeling better before it is completed unless you develop an allergic reaction or are told by a medical provider to stop taking it. We have sent a sample of your urine off for a urine culture.  This will help us  determine what bacteria is causing your symptoms as well as the most appropriate antibiotic to treat it.  If we need to make any adjustments to your medication regimen and new medication will be sent to the pharmacy on file and you will be updated via phone call in MyChart. Stay well hydrated (at least 75 oz of water per day) and avoid holding your urine for prolonged periods of time. If you have any of the following please return to urgent care or go to the emergency room: Persistent symptoms, fever, trouble urinating or inability to urinate, confusion, flank pain.    [facility] Address: [address], [place], [place] [address] Phone: [phone]  [facility] Address: [address], [place], [place] [address] Phone: [phone]     ED Prescriptions     Medication Sig Dispense Auth. Provider   Doxylamine -Pyridoxine  10-10 MG TBEC Take 1 tablet by mouth at bedtime as needed. 60 tablet Katharin Schneider E, PA-C   cephALEXin  (KEFLEX ) 500 MG capsule Take 1 capsule (500 mg total) by mouth 4 (four) times daily for 5 days. 20 capsule Latandra Loureiro E, PA-C      PDMP not reviewed this encounter.   Sukhman Kocher E, PA-C 10/26/23 [id number]

## 2023-10-28 ENCOUNTER — Ambulatory Visit (HOSPITAL_COMMUNITY): Payer: Self-pay

## 2023-10-28 LAB — CERVICOVAGINAL ANCILLARY ONLY
Bacterial Vaginitis (gardnerella): POSITIVE — AB
Candida Glabrata: NEGATIVE
Candida Vaginitis: NEGATIVE
Chlamydia: NEGATIVE
Comment: NEGATIVE
Comment: NEGATIVE
Comment: NEGATIVE
Comment: NEGATIVE
Comment: NEGATIVE
Comment: NORMAL
Neisseria Gonorrhea: NEGATIVE
Trichomonas: NEGATIVE

## 2023-10-28 MED ORDER — METRONIDAZOLE 0.75 % VA GEL: 1.0000 | Freq: Every day | VAGINAL | 0 refills | Status: AC

## 2023-10-30 LAB — URINE CULTURE: Culture: >=100000 — AB

## 2023-11-02 ENCOUNTER — Encounter: Payer: Self-pay | Admitting: Advanced Practice Midwife

## 2023-11-02 DIAGNOSIS — Z8759 Personal history of other complications of pregnancy, childbirth and the puerperium: Secondary | ICD-10-CM | POA: Insufficient documentation

## 2023-11-13 ENCOUNTER — Encounter: Payer: Self-pay | Admitting: *Deleted

## 2023-11-16 ENCOUNTER — Other Ambulatory Visit (INDEPENDENT_AMBULATORY_CARE_PROVIDER_SITE_OTHER): Payer: MEDICAID

## 2023-11-16 ENCOUNTER — Ambulatory Visit: Payer: MEDICAID | Admitting: *Deleted

## 2023-11-16 VITALS — BP 123/84 | HR 99 | Wt 121.0 lb

## 2023-11-16 DIAGNOSIS — Z3A01 Less than 8 weeks gestation of pregnancy: Secondary | ICD-10-CM

## 2023-11-16 DIAGNOSIS — O0991 Supervision of high risk pregnancy, unspecified, first trimester: Secondary | ICD-10-CM

## 2023-11-16 DIAGNOSIS — O3680X0 Pregnancy with inconclusive fetal viability, not applicable or unspecified: Secondary | ICD-10-CM

## 2023-11-16 DIAGNOSIS — O219 Vomiting of pregnancy, unspecified: Secondary | ICD-10-CM

## 2023-11-16 DIAGNOSIS — O0990 Supervision of high risk pregnancy, unspecified, unspecified trimester: Secondary | ICD-10-CM

## 2023-11-16 MED ORDER — PROMETHAZINE HCL 25 MG PO TABS: 25.0000 mg | ORAL_TABLET | Freq: Four times a day (QID) | ORAL | 1 refills | Status: AC | PRN

## 2023-11-16 MED ORDER — BLOOD PRESSURE KIT DEVI: 1.0000 | 0 refills | Status: AC

## 2023-11-16 MED ORDER — PRENATAL PLUS VITAMIN/MINERAL 27-1 MG PO TABS: 1.0000 | ORAL_TABLET | Freq: Every day | ORAL | 12 refills | Status: AC

## 2023-11-16 NOTE — Progress Notes (Signed)
 New OB Intake  I connected with Ashelyn Scioli  on 11/16/23 at 10:15 AM EDT by In Person Visit and verified that I am speaking with the correct person using two identifiers. Nurse is located at [facility] and pt is located at [place].  I discussed the limitations, risks, security and privacy concerns of performing an evaluation and management service by telephone and the availability of in person appointments. I also discussed with the patient that there may be a patient responsible charge related to this service. The patient expressed understanding and agreed to proceed.  I explained I am completing New OB Intake today. We discussed EDD of 07/03/24 based on US  at [redacted]w[redacted]d weeks. Pt is G2P0100. I reviewed her allergies, medications and Medical/Surgical/OB history.    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. Discussed the nature of our practice with multiple providers including residents and students as well as female and female providers. Due to the size of the practice, the delivering provider may not be the same as those providing prenatal care.   Patient is not interested in water birth.  MyChart/Babyscripts MyChart access verified. I explained pt will have some visits in office and some virtually. Babyscripts instructions given and order placed. Patient verifies receipt of registration text/e-mail. Account successfully created and app downloaded. If patient is a candidate for Optimized scheduling, add to sticky note.   Blood  Pressure Cuff/Weight Scale Blood pressure cuff ordered for patient to pick-up from [employer]. Explained after first prenatal appt pt will check weekly and document in Babyscripts. Patient does not have weight scale; patient may purchase if they desire to track weight weekly in Babyscripts.  Anatomy US  Explained first scheduled US  will be around 19 weeks. Anatomy US  scheduled for TBD at TBD.  Is patient a candidate for Babyscripts Optimization? No, due to Risk Factors   First visit review I reviewed new OB appt with patient. Explained pt will be seen by Nidia Daring, NP at first visit. Discussed [place] genetic screening with patient. Requests Panorama and Horizon.. Routine prenatal labs OB Urine collected at today's visit. Initial OB labs deferred to New OB appt.   Last Pap No results found for: DIAGPAP  Rocky CHRISTELLA Ober, RN 11/16/2023  11:27 AM

## 2023-11-16 NOTE — Patient Instructions (Signed)

## 2023-11-22 LAB — URINE CULTURE, OB REFLEX

## 2023-11-22 LAB — CULTURE, OB URINE

## 2023-11-23 ENCOUNTER — Encounter: Payer: Self-pay | Admitting: Obstetrics and Gynecology

## 2023-11-23 DIAGNOSIS — R8271 Bacteriuria: Secondary | ICD-10-CM | POA: Insufficient documentation

## 2023-11-24 ENCOUNTER — Encounter: Payer: MEDICAID | Admitting: Advanced Practice Midwife

## 2023-11-24 DIAGNOSIS — Z6791 Unspecified blood type, Rh negative: Secondary | ICD-10-CM | POA: Insufficient documentation

## 2023-11-24 NOTE — Progress Notes (Deleted)
   Subjective:  Judy Gibson is a 23 y.o. G2P0100 at [redacted]w[redacted]d being seen today for initiation of prenatal care.  She is currently monitored for the following issues for this high-risk pregnancy and has Heroin overdose (HCC); Hyperglycemia; Elevated serum creatinine; MDD (major depressive disorder), recurrent episode, severe (HCC); Sedative, hypnotic or anxiolytic abuse (HCC); Abscess of left breast; History of IUFD; Substance use disorder; Lumbar vertebral fracture (HCC); Supervision of high risk pregnancy, antepartum; and GBS bacteriuria on their problem list.  Patient reports {sx:14538}.   .  .   . Denies leaking of fluid.   The following portions of the patient's history were reviewed and updated as appropriate: allergies, current medications, past family history, past medical history, past social history, past surgical history and problem list. Problem list updated.  Hx 29 week IUFD in 2024.   Hx SUD Heroin, Fentanyl , Subutex, Suboxone.   Objective:  There were no vitals filed for this visit.  Fetal Status:           General:  Alert, oriented and cooperative. Patient is in no acute distress.  Skin: Skin is warm and dry. No rash noted.   Cardiovascular: Normal heart rate noted  Respiratory: Normal respiratory effort, no problems with respiration noted  Abdomen: Soft, gravid, appropriate for gestational age.       Pelvic:       {Blank single:19197::Cervical exam performed,Cervical exam deferred}        Extremities: Normal range of motion.     Mental Status: Normal mood and affect. Normal behavior. Normal judgment and thought content.   Urinalysis:      PDMP reviewed during this encounter.   Last UDS: No results found for: CREATIUR   Assessment and Plan:  Pregnancy: G2P0100 at [redacted]w[redacted]d  1. History of IUFD (Primary)  2. Substance use disorder  3. Supervision of high risk pregnancy, antepartum  4. GBS bacteriuria  5. [redacted] weeks gestation of pregnancy   {Blank  single:19197::Term,Preterm} labor symptoms and general obstetric precautions including but not limited to vaginal bleeding, contractions, leaking of fluid and fetal movement were reviewed in detail with the patient. Please refer to After Visit Summary for other counseling recommendations.   No follow-ups on file.   Future Appointments  Date Time Provider Department Center  11/24/2023  2:15 PM Claudene Reining , CNM [facility]  12/21/2023  9:55 AM Delores Nidia CROME, FNP [facility] None  02/15/2024  7:00 AM [facility] PROVIDER 1 [facility]  02/15/2024  7:30 AM [facility] US4 [facility]    Total face-to-face time with patient: {Blank single:19197::10,15,20,25,30} minutes.  Over 50% of encounter was spent on counseling and coordination of care.   Rucha Wissinger  Claudene HOWARD 11/24/2023 11:47 AM Center for [employer]

## 2023-12-08 ENCOUNTER — Other Ambulatory Visit (HOSPITAL_COMMUNITY)
Admission: RE | Admit: 2023-12-08 | Discharge: 2023-12-08 | Disposition: A | Discharge status: Home / Self Care | Payer: MEDICAID | Source: Ambulatory Visit | Attending: Family Medicine | Admitting: Family Medicine

## 2023-12-08 ENCOUNTER — Other Ambulatory Visit: Payer: Self-pay

## 2023-12-08 ENCOUNTER — Ambulatory Visit: Payer: MEDICAID | Admitting: Family Medicine

## 2023-12-08 VITALS — BP 132/88 | HR 85 | Wt 122.0 lb

## 2023-12-08 DIAGNOSIS — Z6741 Type O blood, Rh negative: Secondary | ICD-10-CM

## 2023-12-08 DIAGNOSIS — O99321 Drug use complicating pregnancy, first trimester: Secondary | ICD-10-CM

## 2023-12-08 DIAGNOSIS — F1190 Opioid use, unspecified, uncomplicated: Secondary | ICD-10-CM

## 2023-12-08 DIAGNOSIS — N898 Other specified noninflammatory disorders of vagina: Secondary | ICD-10-CM | POA: Diagnosis present

## 2023-12-08 DIAGNOSIS — O99820 Streptococcus B carrier state complicating pregnancy: Secondary | ICD-10-CM | POA: Diagnosis not present

## 2023-12-08 DIAGNOSIS — S32009S Unspecified fracture of unspecified lumbar vertebra, sequela: Secondary | ICD-10-CM

## 2023-12-08 DIAGNOSIS — Z3481 Encounter for supervision of other normal pregnancy, first trimester: Secondary | ICD-10-CM

## 2023-12-08 DIAGNOSIS — R8271 Bacteriuria: Secondary | ICD-10-CM

## 2023-12-08 DIAGNOSIS — O26891 Other specified pregnancy related conditions, first trimester: Secondary | ICD-10-CM

## 2023-12-08 DIAGNOSIS — Z3A10 10 weeks gestation of pregnancy: Secondary | ICD-10-CM | POA: Diagnosis not present

## 2023-12-08 DIAGNOSIS — B9689 Other specified bacterial agents as the cause of diseases classified elsewhere: Secondary | ICD-10-CM

## 2023-12-08 DIAGNOSIS — F1990 Other psychoactive substance use, unspecified, uncomplicated: Secondary | ICD-10-CM

## 2023-12-08 DIAGNOSIS — F1120 Opioid dependence, uncomplicated: Secondary | ICD-10-CM | POA: Insufficient documentation

## 2023-12-08 DIAGNOSIS — Z6791 Unspecified blood type, Rh negative: Secondary | ICD-10-CM

## 2023-12-08 DIAGNOSIS — O0990 Supervision of high risk pregnancy, unspecified, unspecified trimester: Secondary | ICD-10-CM

## 2023-12-08 DIAGNOSIS — Z8759 Personal history of other complications of pregnancy, childbirth and the puerperium: Secondary | ICD-10-CM

## 2023-12-08 DIAGNOSIS — B3731 Acute candidiasis of vulva and vagina: Secondary | ICD-10-CM

## 2023-12-08 DIAGNOSIS — Z31430 Encounter of female for testing for genetic disease carrier status for procreative management: Secondary | ICD-10-CM

## 2023-12-08 MED ORDER — ONDANSETRON 4 MG PO TBDP: 4.0000 mg | ORAL_TABLET | Freq: Four times a day (QID) | ORAL | 0 refills | Status: DC | PRN

## 2023-12-08 MED ORDER — BUPRENORPHINE HCL-NALOXONE HCL 8-2 MG SL FILM: 1.0000 | ORAL_FILM | Freq: Three times a day (TID) | SUBLINGUAL | 0 refills | Status: AC

## 2023-12-08 MED ORDER — CYCLOBENZAPRINE HCL 10 MG PO TABS: 10.0000 mg | ORAL_TABLET | Freq: Three times a day (TID) | ORAL | 0 refills | Status: AC | PRN

## 2023-12-08 MED ORDER — ONDANSETRON 4 MG PO TBDP: 4.0000 mg | ORAL_TABLET | Freq: Three times a day (TID) | ORAL | 3 refills | Status: AC | PRN

## 2023-12-08 NOTE — Patient Instructions (Addendum)
 Instruction for starting buprenorphine-naloxone  (Suboxone) at home  You should not mix buprenorphine-naloxone  with other drugs especially large amounts of alcohol or benzodiazepines (Valium, Klonopin, Xanax, Ativan ). If you have taken any of these medication, please tell your healthcare team and do not take buprenorphine-naloxone .   You must wait until you are feeling signs of withdrawal from opiates (heroin, pain pills, fentanyl ) before you take buprenorphine-naloxone .  If you do not wait long enough the medication will make you sicker.  If you do take it too soon and get sicker then wait until later when you feel signs of withdrawal listed below and then try again.   Signs that you are withdrawing: Anxiety, restlessness, can't sit still Aches Nausea or sick to your stomach Diarrhea Goose-bumps Racing heart  Frequent yawning  You should have ALL of these symptoms before you start taking your first dose of buprenorphine-naloxone . If you are not sure call your healthcare team.    When it's time to take your first dose Take 2 or 3 films or tablets depending on what you have discussed with your doctor Make sure your mouth is empty of everything (no candy/gum/etc) Sit or stand, but do not lie down Swallow a sip of water to wet your mouth  Put the tablet(s) or film(s) under your tongue. Do not suck or swallow it. It must stay there until it is completely dissolved. Try to not even swallow your spit during this time. Anything that you swallow will not make you feel better.   In one hour: You should start feeling a little better. You can take the another pill or film the same way you took the first one if you do not feel total relief of your withdrawal symptoms.   In 2 hours: if you are still feeling symptoms of withdrawal listed above you can take another full pill or film. You can repeat this if needed until you take a total dose of up to 32 mg of Suboxone. You may need less than this to control  your symptoms.  You should adjust your daily dose so that you are are not experiencing withdrawal symptoms or cravings.   The next day:  In the morning you can take the same amount you took yesterday but spread out evenly over the day.    If you have any questions or concerns at any time call your healthcare team. For emergencies go to Maternity Assessment Unit at [facility] hospital entrance C.  Office Number:  [phone] [facility] [address] [place], [place] [address] [phone] Hours: Sunday Closed Monday 9AM-6PM Tuesday 9AM-6PM Wednesday 9AM-6PM Thursday 9AM-6PM Friday           9AM-6PM Saturday         10 AM-1PM  Second Trimester of Pregnancy  The second trimester of pregnancy is from week 14 through week 27. This is months 4 through 6 of pregnancy. During the second trimester: Morning sickness is less or has stopped. You may have more energy. You may feel hungry more often. At this time, your unborn baby is growing very fast. At the end of the sixth month, the unborn baby may be up to 12 inches long and weigh about 1 pounds. You will likely start to feel the baby move between 16 and 20 weeks of pregnancy. Body changes during your second trimester Your body continues to change during this time. The changes usually go away after your baby is born. Physical changes You will gain more  weight. Your belly will get bigger. You may begin to get stretch marks on your hips, belly, and breasts. Your breasts will keep growing and may hurt. You may get dark spots or blotches on your face. A dark line from your belly button to the pubic area may appear. This line is called linea nigra. Your hair may grow faster and get thicker. Health changes You may have headaches. You may have heartburn. You may pee more often. You may have swollen, bulging veins (varicose veins). You may have trouble pooping (constipation), or swollen veins in the butt that can itch or get painful  (hemorrhoids). You may have back pain. This is caused by: Weight gain. Pregnancy hormones that are relaxing the joints in your pelvis. Follow these instructions at home: Medicines Talk to your health care provider if you're taking medicines. Ask if the medicines are safe to take during pregnancy. Your provider may change the medicines that you take. Do not take any medicines unless told to by your provider. Take a prenatal vitamin that has at least 600 micrograms (mcg) of folic acid. Do not use herbal medicines, illegal drugs, or medicines that are not approved by your provider. Eating and drinking While you're pregnant your body needs extra food for your growing baby. Talk with your provider about what to eat while pregnant. Activity Most women are able to exercise during pregnancy. Exercises may need to change as your pregnancy goes on. Talk to your provider about your activities and exercise routines. Relieving pain and discomfort Wear a good, supportive bra if your breasts hurt. Rest with your legs raised if you have leg cramps or low back pain. Take warm sitz baths to soothe pain from hemorrhoids. Use hemorrhoid cream if your provider says it's okay. Do not douche. Do not use tampons or scented pads. Do not use hot tubs, steam rooms, or saunas. Safety Wear your seatbelt at all times when you're in a car. Talk to your provider if someone hits you, hurts you, or yells at you. Talk with your provider if you're feeling sad or have thoughts of hurting yourself. Lifestyle Certain things can be harmful while you're pregnant. It's best to avoid the following: Do not drink alcohol,smoke, vape, or use products with nicotine  or tobacco in them. If you need help quitting, talk with your provider. Avoid cat litter boxes and soil used by cats. These things carry germs that can cause harm to your pregnancy and your baby. General instructions Keep all follow-up visits. It helps you and your  unborn baby stay as healthy as possible. Write down your questions. Take them to your prenatal visits. Your provider will: Talk with you about your overall health. Give you advice or refer you to specialists who can help with different needs, including: Prenatal education classes. Mental health and counseling. Foods and healthy eating. Ask for help if you need help with food. Where to find more information American Pregnancy Association: americanpregnancy.org [employer]necologists: acog.org Office on [employer] Health: [employer] Contact a health care provider if: You have a headache that does not go away when you take medicine. You have any of these problems: You can't eat or drink. You throw up or feel like you may throw up. You have watery poop (diarrhea) for 2 days or more. You have pain when you pee or your pee smells bad. You have been sick for 2 days or more and are not getting better. Contact your provider right away if: You  have any of these coming from your vagina: Abnormal discharge. Bad-smelling fluid. Bleeding. Your baby is moving less than usual. You have contractions, belly cramping, or have pain in your pelvis or lower back. You have symptoms of high blood pressure or preeclampsia. These include: A severe, throbbing headache that does not go away. Sudden or extreme swelling of your face, hands, legs, or feet. Vision problems: You see spots. You have blurry vision. Your eyes are sensitive to light. If you can't reach the provider, go to an urgent care or emergency room. Get help right away if: You faint, become confused, or can't think clearly. You have chest pain or trouble breathing. You have any kind of injury, such as from a fall or a car crash. These symptoms may be an emergency. Call 911 right away. Do not wait to see if the symptoms will go away. Do not drive yourself to the hospital. This information is not intended to  replace advice given to you by your health care provider. Make sure you discuss any questions you have with your health care provider. Document Revised: 11/13/2022 Document Reviewed: 06/13/2022 Elsevier Patient Education  2024 [employer].

## 2023-12-08 NOTE — Progress Notes (Signed)
 Subjective:   Judy Gibson is a 23 y.o. G2P0100 here today for initial REACH visit seeking OUD management, prenatal care and substance exposed newborn consult.  She reports fentanyl  use, most recently this morning, and desires OUD management from [facility] clinic.  She is accompanied by her partner, Judy Gibson, who reports being ~90 days in recovery.  Health Maintenance Due  Topic Date Due   HPV VACCINES (1 - 3-dose series) Never done   Meningococcal B Vaccine (1 of 2 - Standard) Never done   Hepatitis C Screening  Never done   Hepatitis B Vaccines 19-59 Average Risk (1 of 3 - 19+ 3-dose series) Never done   Influenza Vaccine  Never done   COVID-19 Vaccine (1 - 2025-26 season) Never done    Past Medical History:  Diagnosis Date   Anxiety    Depression    Heroin abuse (HCC)    Substance abuse (HCC)     Past Surgical History:  Procedure Laterality Date   NO PAST SURGERIES      The following portions of the patient's history were reviewed and updated as appropriate: allergies, current medications, past family history, past medical history, past social history, past surgical history and problem list.     Objective:   Judy Gibson is well appearing in no distress.  She asks appropriate questions and has clear communication.  Assessment and Plan:  Judy Gibson and her partner were oriented to [facility] and services.  She reports IUFD with first pregnancy.  We briefly reviewed labor and delivery and newborn observation period as well as Eat, Sleep and Console.  She desires to breast feed this infant-lactation support information reviewed.  REACH clinic consult information given for needs that arise prior to next visit. Problem List Items Addressed This Visit       Musculoskeletal and Integument   Lumbar vertebral fracture (HCC)     Other   History of IUFD   Substance use disorder   Relevant Orders   ToxAssure Flex 15, Ur   Comp Met (CMET)   Hepatitis B surface antibody,quantitative    Hepatitis A Ab, Total   Supervision of high risk pregnancy, antepartum - Primary   Relevant Orders   CBC/D/Plt+RPR+Rh+ABO+RubIgG...   HgB A1c   ToxAssure Flex 15, Ur   Comp Met (CMET)   Hepatitis B surface antibody,quantitative   Hepatitis A Ab, Total   Culture, OB Urine   GBS bacteriuria   Rh negative state in antepartum period   Opioid use disorder   Relevant Orders   ToxAssure Flex 15, Ur   Comp Met (CMET)   Hepatitis B surface antibody,quantitative   Hepatitis A Ab, Total   Other Visit Diagnoses       [redacted] weeks gestation of pregnancy       Relevant Orders   CBC/D/Plt+RPR+Rh+ABO+RubIgG...   HgB A1c   ToxAssure Flex 15, Ur   Comp Met (CMET)   Hepatitis B surface antibody,quantitative   Hepatitis A Ab, Total   Culture, OB Urine     Encounter for supervision of other normal pregnancy in first trimester       Relevant Orders   PANORAMA PRENATAL TEST     Encounter of female for testing for genetic disease carrier status for procreative management       Relevant Orders   HORIZON Basic Panel       Routine preventative health maintenance measures emphasized. Please refer to After Visit Summary for other counseling recommendations.   Return in 4 weeks (  on 01/05/2024) for ob visit, REACH clinic.    Total face-to-face time with patient: 20 minutes.  Over 50% of encounter was spent on counseling and coordination of care.   Delon LITTIE Frater, NNP-BC Neonatal Nurse Practitioner Substance Exposed Newborn Consult at the [facility] [phone]

## 2023-12-08 NOTE — Progress Notes (Signed)
 Subjective:   Judy Gibson is a 23 y.o. G2P0100 at [redacted]w[redacted]d by early ultrasound being seen today for her first obstetrical visit.  Her obstetrical history is significant for OUD, hx of IUFD. Patient does intend to breast feed. Pregnancy history fully reviewed.  Patient reports using fentanyl  daily, around 2-3 grams with last use around 0500 this AM. Mostly injecting though trying to do that less since becoming pregnant. No knowing use of other substances. Has been on suboxone in the past and found it helpful. Has precipitated withdrawal in the past when trying to switch to suboxone.   HISTORY: OB History  Gravida Para Term Preterm AB Living  2 1 0 1 0 0  SAB IAB Ectopic Multiple Live Births  0 0 0 0 0    # Outcome Date GA Lbr Len/2nd Weight Sex Type Anes PTL Lv  2 Current           1 Preterm 05/29/22 [redacted]w[redacted]d  1 lb 15.9 oz (0.905 kg) M Vag-Spont None Y FD     Complications: Fetal intrapartum death, Imprisonment     Name: Baby Boy (Jalynn) Etheredge     Apgar1: 0  Apgar5: 0     Last pap smear: Result Date Procedure Results Follow-ups  01/06/2022 Cytology - PAP Pap Smear: NILM Pap in 3 years: due 01/06/2025     Past Medical History:  Diagnosis Date   Anxiety    Depression    Heroin abuse (HCC)    Substance abuse (HCC)    Past Surgical History:  Procedure Laterality Date   NO PAST SURGERIES     Family History  Problem Relation Age of Onset   Healthy Mother    COPD Father    Social History   Tobacco Use   Smoking status: Former    Current packs/day: 1.00    Average packs/day: 1 pack/day for 3.0 years (3.0 ttl pk-yrs)    Types: Cigarettes   Smokeless tobacco: Never  Vaping Use   Vaping status: Never Used  Substance Use Topics   Alcohol use: Not Currently    Alcohol/week: 1.0 standard drink of alcohol    Types: 1 Cans of beer per week    Comment: 1 beer one yr ago   Drug use: Not Currently    Types: Heroin, Marijuana, Methamphetamines, Fentanyl     Comment:  xanax substance use 1xday for past few weeks   No Known Allergies Current Outpatient Medications on File Prior to Visit  Medication Sig Dispense Refill   promethazine  (PHENERGAN ) 25 MG tablet Take 1 tablet (25 mg total) by mouth every 6 (six) hours as needed for nausea or vomiting. 30 tablet 1   Blood Pressure Monitoring (BLOOD PRESSURE KIT) DEVI 1 Device by Does not apply route once a week. (Patient not taking: Reported on 12/08/2023) 1 each 0   Doxylamine -Pyridoxine  10-10 MG TBEC Take 1 tablet by mouth at bedtime as needed. (Patient not taking: Reported on 12/08/2023) 60 tablet 0   Ferrous Sulfate (IRON PO) Take 1 tablet by mouth daily. (Patient not taking: Reported on 12/08/2023)     Prenatal Vit-Fe Fumarate-FA (MULTIVITAMIN-PRENATAL) 27-0.8 MG TABS tablet Take 1 tablet by mouth daily at 12 noon. (Patient not taking: Reported on 12/08/2023)     Prenatal Vit-Fe Fumarate-FA (PRENATAL PLUS VITAMIN/MINERAL) 27-1 MG TABS Take 1 tablet by mouth daily. (Patient not taking: Reported on 12/08/2023) 30 tablet 12   [DISCONTINUED] citalopram  (CELEXA ) 10 MG tablet Take 1 tablet (10 mg total) by  mouth daily. 30 tablet 0   No current facility-administered medications on file prior to visit.     Objective   Vitals:   12/08/23 1429  BP: 132/88  Pulse: 85   Fetal Heart Rate (bpm): 178 (by bedside US  with M mode, significant fetal movement made measurement very difficult)  System: General: well-developed, well-nourished female in no acute distress   Skin: normal coloration and turgor, no rashes   Neurologic: oriented, normal, negative, normal mood   Extremities: normal strength, tone, and muscle mass, ROM of all joints is normal   HEENT PERRLA, extraocular movement intact and sclera clear, anicteric   Neck supple and no masses   Respiratory:  no respiratory distress    Labs PDMP reviewed during this encounter.    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 Supervision of high risk pregnancy, antepartum (Primary) BP and FHR normal in context of significant fetal movement Initial labs drawn. Continue prenatal vitamins. Genetic Screening discussed, NIPS: ordered. Ultrasound discussed; fetal anatomic survey: ordered. Problem list reviewed and updated. The nature of [facility] with multiple MDs and other Advanced Practice Providers was explained to patient; also emphasized that residents, students are part of our team.  2. Rh negative state in antepartum period Rhogam at 28 wks  3. History of IUFD Diagnosed at 29 wk prenatal visit in last pregnancy Work up included antiphospholipid syndrome Ab's, parvovirus, toxoplasma, SNP microarray, all normal Placental pathology with chorio but otherwise unremarkable Antenatal testing per MFM guidelines  4. GBS bacteriuria 2k colonies, ppx in labor  5. Substance use disorder Introduced to [facility] care team and model of care Reviewed available options for MOUD, suboxone vs methadone Prefers suboxone Discussed high dose induction in detail, prefers to attempt at home but aware that I am working inpatient tomorrow 8-5 and can go there for assistance if its not going well RTC in one week to reassess UDS sent today with patient permission   Routine obstetric precautions reviewed. Return in 1 week (on 12/15/2023) for  ob visit, REACH clinic.    Donnice CHRISTELLA Carolus, MD/MPH Attending Family Medicine Physician, [facility] for [facility], [facility]

## 2023-12-08 NOTE — Addendum Note (Signed)
 Addended by: HONORE VERNELL BRAVO on: 12/08/2023 05:33 PM   Modules accepted: Orders

## 2023-12-09 ENCOUNTER — Telehealth: Payer: Self-pay | Admitting: Clinical

## 2023-12-09 ENCOUNTER — Encounter: Payer: Self-pay | Admitting: Family Medicine

## 2023-12-09 LAB — CBC/D/PLT+RPR+RH+ABO+RUBIGG...
Antibody Screen: NEGATIVE
Basophils Absolute: 0 x10E3/uL (ref 0.0–0.2)
Basos: 0 %
EOS (ABSOLUTE): 0.1 x10E3/uL (ref 0.0–0.4)
Eos: 1 %
HCV Ab: NONREACTIVE
HIV Screen 4th Generation wRfx: NONREACTIVE
Hematocrit: 37.5 % (ref 34.0–46.6)
Hemoglobin: 12.6 g/dL (ref 11.1–15.9)
Hepatitis B Surface Ag: NEGATIVE
Immature Grans (Abs): 0 x10E3/uL (ref 0.0–0.1)
Immature Granulocytes: 0 %
Lymphocytes Absolute: 1.2 x10E3/uL (ref 0.7–3.1)
Lymphs: 13 %
MCH: 29 pg (ref 26.6–33.0)
MCHC: 33.6 g/dL (ref 31.5–35.7)
MCV: 86 fL (ref 79–97)
Monocytes Absolute: 0.7 x10E3/uL (ref 0.1–0.9)
Monocytes: 8 %
Neutrophils Absolute: 7.1 x10E3/uL — ABNORMAL HIGH (ref 1.4–7.0)
Neutrophils: 78 %
Platelets: 231 x10E3/uL (ref 150–450)
RBC: 4.35 x10E6/uL (ref 3.77–5.28)
RDW: 12.9 % (ref 11.7–15.4)
RPR Ser Ql: NONREACTIVE
Rh Factor: NEGATIVE
Rubella Antibodies, IGG: 4.6 {index} (ref >0.99)
WBC: 9.1 x10E3/uL (ref 3.4–10.8)

## 2023-12-09 LAB — COMPREHENSIVE METABOLIC PANEL WITH GFR
ALT: 15 IU/L (ref 0–32)
AST: 18 IU/L (ref 0–40)
Albumin: 4.5 g/dL (ref 4.0–5.0)
Alkaline Phosphatase: 60 IU/L (ref 41–116)
BUN/Creatinine Ratio: 13 (ref 9–23)
BUN: 8 mg/dL (ref 6–20)
Bilirubin Total: 0.3 mg/dL (ref 0.0–1.2)
CO2: 22 mmol/L (ref 20–29)
Calcium: 9.4 mg/dL (ref 8.7–10.2)
Chloride: 99 mmol/L (ref 96–106)
Creatinine, Ser: 0.63 mg/dL (ref 0.57–1.00)
Globulin, Total: 2.8 g/dL (ref 1.5–4.5)
Glucose: 100 mg/dL — ABNORMAL HIGH (ref 70–99)
Potassium: 3.7 mmol/L (ref 3.5–5.2)
Sodium: 135 mmol/L (ref 134–144)
Total Protein: 7.3 g/dL (ref 6.0–8.5)
eGFR: 128 mL/min/1.73 (ref >59)

## 2023-12-09 LAB — CERVICOVAGINAL ANCILLARY ONLY
Bacterial Vaginitis (gardnerella): POSITIVE — AB
Candida Glabrata: NEGATIVE
Candida Vaginitis: POSITIVE — AB
Chlamydia: NEGATIVE
Comment: NEGATIVE
Comment: NEGATIVE
Comment: NEGATIVE
Comment: NEGATIVE
Comment: NEGATIVE
Comment: NORMAL
Neisseria Gonorrhea: NEGATIVE
Trichomonas: NEGATIVE

## 2023-12-09 LAB — HEMOGLOBIN A1C
Est. average glucose Bld gHb Est-mCnc: 117 mg/dL
Hgb A1c MFr Bld: 5.7 % — ABNORMAL HIGH (ref 4.8–5.6)

## 2023-12-09 LAB — POCT URINALYSIS DIP (DEVICE)
Bilirubin Urine: NEGATIVE
Glucose, UA: NEGATIVE mg/dL
Hgb urine dipstick: NEGATIVE
Ketones, ur: NEGATIVE mg/dL
Nitrite: NEGATIVE
Protein, ur: NEGATIVE mg/dL
Specific Gravity, Urine: 1.02 (ref 1.005–1.030)
Urobilinogen, UA: 0.2 mg/dL (ref 0.0–1.0)
pH: 7 (ref 5.0–8.0)

## 2023-12-09 LAB — HEPATITIS A ANTIBODY, TOTAL: hep A Total Ab: POSITIVE — AB

## 2023-12-09 LAB — HCV INTERPRETATION

## 2023-12-09 LAB — HEPATITIS B SURFACE ANTIBODY, QUANTITATIVE: Hepatitis B Surf Ab Quant: <3.5 m[IU]/mL — ABNORMAL LOW

## 2023-12-09 MED ORDER — METRONIDAZOLE 500 MG PO TABS: 500.0000 mg | ORAL_TABLET | Freq: Two times a day (BID) | ORAL | 0 refills | Status: DC

## 2023-12-09 MED ORDER — TERCONAZOLE 0.4 % VA CREA: 1.0000 | TOPICAL_CREAM | Freq: Every day | VAGINAL | 0 refills | Status: AC

## 2023-12-09 NOTE — Telephone Encounter (Signed)
Attempt call regarding referral; Unable to leave message as "call cannot be completed at this time".  

## 2023-12-10 ENCOUNTER — Ambulatory Visit: Payer: Self-pay | Admitting: Family Medicine

## 2023-12-10 LAB — URINE CULTURE, OB REFLEX

## 2023-12-10 LAB — CULTURE, OB URINE

## 2023-12-12 LAB — TOXASSURE FLEX 15, UR
6-ACETYLMORPHINE IA: NEGATIVE ng/mL
7-aminoclonazepam: NOT DETECTED ng/mg{creat}
AMPHETAMINES IA: NEGATIVE ng/mL
Alpha-hydroxyalprazolam: NOT DETECTED ng/mg{creat}
Alpha-hydroxymidazolam: NOT DETECTED ng/mg{creat}
Alpha-hydroxytriazolam: NOT DETECTED ng/mg{creat}
Alprazolam: NOT DETECTED ng/mg{creat}
BARBITURATES IA: NEGATIVE ng/mL
BUPRENORPHINE: NEGATIVE
Benzodiazepines: NEGATIVE
Buprenorphine: NOT DETECTED ng/mg{creat}
COCAINE METABOLITE IA: NEGATIVE ng/mL
Clonazepam: NOT DETECTED ng/mg{creat}
Creatinine: 100 mg/dL (ref >=20)
Desalkylflurazepam: NOT DETECTED ng/mg{creat}
Desmethyldiazepam: NOT DETECTED ng/mg{creat}
Desmethylflunitrazepam: NOT DETECTED ng/mg{creat}
Diazepam: NOT DETECTED ng/mg{creat}
ETHYL ALCOHOL Enzymatic: NEGATIVE g/dL
FENTANYL: POSITIVE
Fentanyl: 131 ng/mg{creat}
Flunitrazepam: NOT DETECTED ng/mg{creat}
Lorazepam: NOT DETECTED ng/mg{creat}
METHADONE IA: NEGATIVE ng/mL
METHADONE MTB IA: NEGATIVE ng/mL
Midazolam: NOT DETECTED ng/mg{creat}
Norbuprenorphine: NOT DETECTED ng/mg{creat}
Norfentanyl: >1000 ng/mg{creat}
OPIATE CLASS IA: NEGATIVE ng/mL
OXYCODONE CLASS IA: NEGATIVE ng/mL
Oxazepam: NOT DETECTED ng/mg{creat}
PHENCYCLIDINE IA: NEGATIVE ng/mL
TAPENTADOL, IA: NEGATIVE ng/mL
TRAMADOL IA: NEGATIVE ng/mL
Temazepam: NOT DETECTED ng/mg{creat}

## 2023-12-12 LAB — CANNABINOIDS, MS, UR RFX
Cannabinoids Confirmation: POSITIVE — AB
Carboxy-THC: 22 ng/mg{creat}

## 2023-12-15 ENCOUNTER — Encounter: Payer: MEDICAID | Admitting: Family Medicine

## 2023-12-15 LAB — PANORAMA PRENATAL TEST FULL PANEL:PANORAMA TEST PLUS 5 ADDITIONAL MICRODELETIONS: FETAL FRACTION: 6.3

## 2023-12-15 LAB — HORIZON CUSTOM: REPORT SUMMARY: NEGATIVE

## 2023-12-21 ENCOUNTER — Encounter: Payer: MEDICAID | Admitting: Obstetrics and Gynecology

## 2023-12-29 ENCOUNTER — Ambulatory Visit (INDEPENDENT_AMBULATORY_CARE_PROVIDER_SITE_OTHER): Payer: MEDICAID | Admitting: Advanced Practice Midwife

## 2023-12-29 VITALS — BP 111/75 | HR 87 | Wt 126.5 lb

## 2023-12-29 DIAGNOSIS — Z8759 Personal history of other complications of pregnancy, childbirth and the puerperium: Secondary | ICD-10-CM | POA: Diagnosis not present

## 2023-12-29 DIAGNOSIS — Z6791 Unspecified blood type, Rh negative: Secondary | ICD-10-CM

## 2023-12-29 DIAGNOSIS — Z3A13 13 weeks gestation of pregnancy: Secondary | ICD-10-CM

## 2023-12-29 DIAGNOSIS — R8271 Bacteriuria: Secondary | ICD-10-CM | POA: Diagnosis not present

## 2023-12-29 DIAGNOSIS — O26899 Other specified pregnancy related conditions, unspecified trimester: Secondary | ICD-10-CM

## 2023-12-29 DIAGNOSIS — O26891 Other specified pregnancy related conditions, first trimester: Secondary | ICD-10-CM

## 2023-12-29 DIAGNOSIS — F1990 Other psychoactive substance use, unspecified, uncomplicated: Secondary | ICD-10-CM

## 2023-12-29 DIAGNOSIS — N760 Acute vaginitis: Secondary | ICD-10-CM

## 2023-12-29 DIAGNOSIS — B9689 Other specified bacterial agents as the cause of diseases classified elsewhere: Secondary | ICD-10-CM

## 2023-12-29 DIAGNOSIS — F1120 Opioid dependence, uncomplicated: Secondary | ICD-10-CM

## 2023-12-29 DIAGNOSIS — O0991 Supervision of high risk pregnancy, unspecified, first trimester: Secondary | ICD-10-CM | POA: Diagnosis not present

## 2023-12-29 DIAGNOSIS — O0990 Supervision of high risk pregnancy, unspecified, unspecified trimester: Secondary | ICD-10-CM

## 2023-12-29 DIAGNOSIS — O99321 Drug use complicating pregnancy, first trimester: Secondary | ICD-10-CM

## 2023-12-29 DIAGNOSIS — O219 Vomiting of pregnancy, unspecified: Secondary | ICD-10-CM

## 2023-12-29 MED ORDER — METRONIDAZOLE 0.75 % VA GEL: 1.0000 | Freq: Every day | VAGINAL | 0 refills | Status: AC

## 2023-12-29 MED ORDER — DOXYLAMINE-PYRIDOXINE 10-10 MG PO TBEC: DELAYED_RELEASE_TABLET | ORAL | 3 refills | Status: AC

## 2023-12-29 NOTE — Progress Notes (Signed)
 Subjective:  Judy Gibson is a 23 y.o. G2P0100 at [redacted]w[redacted]d being seen today for ongoing prenatal care.  She is currently monitored for the following issues for this high-risk pregnancy and has Heroin overdose (HCC); Hyperglycemia; MDD (major depressive disorder), recurrent episode, severe (HCC); Sedative, hypnotic or anxiolytic abuse (HCC); Abscess of left breast; History of IUFD; Substance use disorder; Lumbar vertebral fracture (HCC); Supervision of high risk pregnancy, antepartum; GBS bacteriuria; Rh negative state in antepartum period; Opioid use disorder; and Suboxone maintenance treatment complicating pregnancy, antepartum, first trimester (HCC) on their problem list.  Patient reports no complaints.  Contractions: Not present. Vag. Bleeding: None.  Movement: Absent. Denies leaking of fluid.   Has not started home Suboxone induction because she doesn't think she can do it on her own. Asking about hospital induction.   The following portions of the patient's history were reviewed and updated as appropriate: allergies, current medications, past family history, past medical history, past social history, past surgical history and problem list. Problem list updated.  Objective:   Vitals:   12/29/23 1539  BP: 111/75  Pulse: 87  Weight: 126 lb 8 oz (57.4 kg)    Fetal Status: Fetal Heart Rate (bpm): 147   Movement: Absent     General:  Alert, oriented and cooperative. Patient is in no acute distress.  Skin: Skin is warm and dry. No rash noted.   Cardiovascular: Normal heart rate noted  Respiratory: Normal respiratory effort, no problems with respiration noted  Abdomen: Soft, gravid, appropriate for gestational age. Pain/Pressure: Absent     Pelvic: Vag. Bleeding: None     Cervical exam deferred        Extremities: Normal range of motion.  Edema: None  Mental Status: Normal mood and affect. Normal behavior. Normal judgment and thought content.   Urinalysis:      PDMP reviewed during  this encounter.   Last UDS: Lab Results  Component Value Date   CREATIUR 117 12/30/2023     Assessment and Plan:  Pregnancy: G2P0100 at [redacted]w[redacted]d  1. Supervision of high risk pregnancy, antepartum (Primary) - Continue with REACH clinic. - Flu vaccine not addressed. Info given .  - AFP info given to consider at [place].   2. Rh negative state in antepartum period Antibody screen negative, rhogam at 28 wks  3. History of IUFD Diagnosed at 29 wk prenatal visit in last pregnancy Work up included antiphospholipid syndrome Ab's, parvovirus, toxoplasma, SNP microarray, all normal Placental pathology with chorio but otherwise unremarkable Antenatal testing per MFM guidelines  4. GBS bacteriuria 2k colonies, ppx in labor  5. Substance use disorder Discussed high dose home induction for suboxone last visit, Feels like she needs the support if IP induction. Offered to have her come in in the next few days. Pt says she would like to do this.  - ToxAssure Flex 15, Ur  8. Nausea and vomiting during pregnancy - Doxylamine -Pyridoxine  10-10 MG TBEC; Start with 2 tablets every evening. If symptoms persist, add 1 tablet every morning. If symptoms persist, add 1 tablet at mid-day.  Dispense: 124 tablet; Refill: 3    Preterm labor symptoms and general obstetric precautions including but not limited to vaginal bleeding, contractions, leaking of fluid and fetal movement were reviewed in detail with the patient. Please refer to After Visit Summary for other counseling recommendations.  Return in about 2 weeks (around 01/12/2024) for REACH ROB.  Future Appointments  Date Time Provider Department Center  01/12/2024  3:55 PM Lola Cough  M, MD [facility]  02/15/2024  7:00 AM [facility] PROVIDER 1 [facility]  02/15/2024  7:30 AM [facility] US4 [facility]     Dessirae Scarola , CNM

## 2024-01-03 LAB — TOXASSURE FLEX 15, UR
6-ACETYLMORPHINE IA: NEGATIVE ng/mL
7-aminoclonazepam: NOT DETECTED ng/mg{creat}
Alpha-hydroxyalprazolam: NOT DETECTED ng/mg{creat}
Alpha-hydroxymidazolam: NOT DETECTED ng/mg{creat}
Alpha-hydroxytriazolam: NOT DETECTED ng/mg{creat}
Alprazolam: NOT DETECTED ng/mg{creat}
BARBITURATES IA: NEGATIVE ng/mL
BUPRENORPHINE: NEGATIVE
Benzodiazepines: POSITIVE
Buprenorphine: NOT DETECTED ng/mg{creat}
COCAINE METABOLITE IA: NEGATIVE ng/mL
Clonazepam: NOT DETECTED ng/mg{creat}
Creatinine: 117 mg/dL (ref >=20)
Desalkylflurazepam: NOT DETECTED ng/mg{creat}
Desmethyldiazepam: 25 ng/mg{creat}
Desmethylflunitrazepam: NOT DETECTED ng/mg{creat}
Diazepam: NOT DETECTED ng/mg{creat}
ETHYL ALCOHOL Enzymatic: NEGATIVE g/dL
FENTANYL: POSITIVE
Fentanyl: 87 ng/mg{creat}
Flunitrazepam: NOT DETECTED ng/mg{creat}
Lorazepam: NOT DETECTED ng/mg{creat}
METHADONE IA: NEGATIVE ng/mL
METHADONE MTB IA: NEGATIVE ng/mL
Midazolam: NOT DETECTED ng/mg{creat}
Norbuprenorphine: NOT DETECTED ng/mg{creat}
Norfentanyl: >855 ng/mg{creat}
OPIATE CLASS IA: NEGATIVE ng/mL
OXYCODONE CLASS IA: NEGATIVE ng/mL
Oxazepam: 89 ng/mg{creat}
PHENCYCLIDINE IA: NEGATIVE ng/mL
TAPENTADOL, IA: NEGATIVE ng/mL
TRAMADOL IA: NEGATIVE ng/mL
Temazepam: 62 ng/mg{creat}

## 2024-01-03 LAB — AMPHETAMINES, MS, UR RFX
Amphetamine: 111 ng/mg{creat}
Amphetamines Confirmation: POSITIVE — AB
MDA (Ecstasy metabolite): NOT DETECTED ng/mg{creat}
MDMA (Ecstasy): NOT DETECTED ng/mg{creat}
Methamphetamine: 236 ng/mg{creat}

## 2024-01-03 LAB — CANNABINOIDS, MS, UR RFX
Cannabinoids Confirmation: POSITIVE — AB
Carboxy-THC: 121 ng/mg{creat}

## 2024-01-05 ENCOUNTER — Ambulatory Visit: Payer: Self-pay | Admitting: Advanced Practice Midwife

## 2024-01-12 ENCOUNTER — Encounter: Payer: MEDICAID | Admitting: Family Medicine

## 2024-01-27 ENCOUNTER — Other Ambulatory Visit: Payer: Self-pay | Admitting: Advanced Practice Midwife

## 2024-01-27 DIAGNOSIS — F1120 Opioid dependence, uncomplicated: Secondary | ICD-10-CM

## 2024-02-02 ENCOUNTER — Encounter: Payer: MEDICAID | Admitting: Advanced Practice Midwife

## 2024-02-15 ENCOUNTER — Ambulatory Visit

## 2024-02-15 ENCOUNTER — Other Ambulatory Visit: Payer: MEDICAID

## 2024-02-15 NOTE — Telephone Encounter (Signed)
 Judy Gibson/ [facility] calling to advise patient/ inmate at [facility] has now been moved to [facility] facility an no longer needs US  from us .
